# Patient Record
Sex: Male | Born: 1950 | Race: White | Hispanic: No | Smoking: Never smoker
Health system: Southern US, Community
[De-identification: ages and names within clinical notes are randomized; demographics above are authoritative.]

## PROBLEM LIST (undated history)

## (undated) DIAGNOSIS — E785 Hyperlipidemia, unspecified: Secondary | ICD-10-CM

## (undated) DIAGNOSIS — R011 Cardiac murmur, unspecified: Secondary | ICD-10-CM

## (undated) DIAGNOSIS — I341 Nonrheumatic mitral (valve) prolapse: Secondary | ICD-10-CM

## (undated) DIAGNOSIS — I10 Essential (primary) hypertension: Secondary | ICD-10-CM

## (undated) DIAGNOSIS — T1490XA Injury, unspecified, initial encounter: Secondary | ICD-10-CM

## (undated) DIAGNOSIS — I340 Nonrheumatic mitral (valve) insufficiency: Secondary | ICD-10-CM

## (undated) HISTORY — DX: Essential (primary) hypertension: I10

## (undated) HISTORY — DX: Hyperlipidemia, unspecified: E78.5

## (undated) HISTORY — DX: Injury, unspecified, initial encounter: T14.90XA

## (undated) HISTORY — PX: PROSTATE SURGERY: SHX751

## (undated) HISTORY — PX: OTHER SURGICAL HISTORY: SHX169

---

## 1988-11-22 HISTORY — PX: OTHER SURGICAL HISTORY: SHX169

## 2002-09-24 ENCOUNTER — Encounter (INDEPENDENT_AMBULATORY_CARE_PROVIDER_SITE_OTHER): Payer: Self-pay | Admitting: *Deleted

## 2002-09-24 ENCOUNTER — Ambulatory Visit (HOSPITAL_BASED_OUTPATIENT_CLINIC_OR_DEPARTMENT_OTHER): Admission: RE | Admit: 2002-09-24 | Discharge: 2002-09-24 | Payer: Self-pay | Admitting: Surgery

## 2004-12-14 ENCOUNTER — Ambulatory Visit: Payer: Self-pay | Admitting: Internal Medicine

## 2004-12-28 ENCOUNTER — Ambulatory Visit: Payer: Self-pay | Admitting: Internal Medicine

## 2005-01-20 ENCOUNTER — Ambulatory Visit: Payer: Self-pay | Admitting: Internal Medicine

## 2005-02-25 ENCOUNTER — Ambulatory Visit: Payer: Self-pay | Admitting: Internal Medicine

## 2005-04-08 ENCOUNTER — Ambulatory Visit: Payer: Self-pay | Admitting: Internal Medicine

## 2005-08-03 ENCOUNTER — Ambulatory Visit: Payer: Self-pay | Admitting: Internal Medicine

## 2006-11-08 ENCOUNTER — Ambulatory Visit: Payer: Self-pay | Admitting: Internal Medicine

## 2006-11-08 LAB — CONVERTED CEMR LAB
ALT: 39 units/L (ref 0–40)
AST: 34 units/L (ref 0–37)
Alkaline Phosphatase: 48 units/L (ref 39–117)
BUN: 17 mg/dL (ref 6–23)
Basophils Relative: 0.5 % (ref 0.0–1.0)
Calcium: 9.1 mg/dL (ref 8.4–10.5)
Glomerular Filtration Rate, Af Am: 89 mL/min/{1.73_m2}
Hemoglobin: 15.5 g/dL (ref 13.0–17.0)
LDL DIRECT: 159.6 mg/dL
Lymphocytes Relative: 31.7 % (ref 12.0–46.0)
Neutrophils Relative %: 58.9 % (ref 43.0–77.0)
PSA: 5.7 ng/mL — ABNORMAL HIGH (ref 0.10–4.00)
Potassium: 3.5 meq/L (ref 3.5–5.1)
RDW: 12.7 % (ref 11.5–14.6)
TSH: 1.78 microintl units/mL (ref 0.35–5.50)
Total Protein: 6.7 g/dL (ref 6.0–8.3)
WBC: 7.4 10*3/uL (ref 4.5–10.5)

## 2006-11-17 ENCOUNTER — Ambulatory Visit: Payer: Self-pay | Admitting: Internal Medicine

## 2007-01-11 ENCOUNTER — Encounter: Payer: Self-pay | Admitting: Internal Medicine

## 2007-05-29 ENCOUNTER — Ambulatory Visit: Payer: Self-pay | Admitting: Internal Medicine

## 2007-05-29 LAB — CONVERTED CEMR LAB
CO2: 31 meq/L (ref 19–32)
Cholesterol: 172 mg/dL (ref 0–200)
PSA: 6.31 ng/mL — ABNORMAL HIGH (ref 0.10–4.00)
Potassium: 3.8 meq/L (ref 3.5–5.1)
Total CHOL/HDL Ratio: 3.8
Triglycerides: 60 mg/dL (ref 0–149)

## 2007-06-06 ENCOUNTER — Encounter: Payer: Self-pay | Admitting: Internal Medicine

## 2007-06-06 ENCOUNTER — Ambulatory Visit: Payer: Self-pay | Admitting: Internal Medicine

## 2007-06-06 DIAGNOSIS — I10 Essential (primary) hypertension: Secondary | ICD-10-CM | POA: Insufficient documentation

## 2007-06-07 ENCOUNTER — Telehealth: Payer: Self-pay | Admitting: *Deleted

## 2007-07-17 ENCOUNTER — Encounter: Payer: Self-pay | Admitting: Internal Medicine

## 2007-10-23 ENCOUNTER — Encounter: Payer: Self-pay | Admitting: Internal Medicine

## 2007-11-21 ENCOUNTER — Ambulatory Visit: Payer: Self-pay | Admitting: Internal Medicine

## 2007-11-21 LAB — CONVERTED CEMR LAB
ALT: 40 units/L (ref 0–53)
Alkaline Phosphatase: 52 units/L (ref 39–117)
Basophils Absolute: 0 10*3/uL (ref 0.0–0.1)
Eosinophils Absolute: 0.1 10*3/uL (ref 0.0–0.6)
Eosinophils Relative: 1.3 % (ref 0.0–5.0)
GFR calc Af Amer: 81 mL/min
Glucose, Bld: 104 mg/dL — ABNORMAL HIGH (ref 70–99)
Glucose, Urine, Semiquant: NEGATIVE
HCT: 45.8 % (ref 39.0–52.0)
Hemoglobin: 15.9 g/dL (ref 13.0–17.0)
LDL Cholesterol: 146.9 mg/dL
Lymphocytes Relative: 30 % (ref 12.0–46.0)
MCHC: 34.8 g/dL (ref 30.0–36.0)
MCV: 88.8 fL (ref 78.0–100.0)
Monocytes Absolute: 0.5 10*3/uL (ref 0.2–0.7)
Neutro Abs: 5.3 10*3/uL (ref 1.4–7.7)
Neutrophils Relative %: 62.3 % (ref 43.0–77.0)
RBC: 5.15 M/uL (ref 4.22–5.81)
Sodium: 142 meq/L (ref 135–145)
Specific Gravity, Urine: 1.025
TSH: 1.29 microintl units/mL (ref 0.35–5.50)
Total Bilirubin: 1 mg/dL (ref 0.3–1.2)
Total Protein: 6.7 g/dL (ref 6.0–8.3)
Triglycerides: 101 mg/dL (ref 0–149)
WBC Urine, dipstick: NEGATIVE
WBC: 8.5 10*3/uL (ref 4.5–10.5)
pH: 6

## 2007-12-11 ENCOUNTER — Ambulatory Visit: Payer: Self-pay | Admitting: Internal Medicine

## 2007-12-11 DIAGNOSIS — E785 Hyperlipidemia, unspecified: Secondary | ICD-10-CM | POA: Insufficient documentation

## 2007-12-11 LAB — CONVERTED CEMR LAB: HDL goal, serum: 40 mg/dL

## 2009-10-07 ENCOUNTER — Telehealth: Payer: Self-pay | Admitting: Internal Medicine

## 2009-10-31 ENCOUNTER — Ambulatory Visit: Payer: Self-pay | Admitting: Internal Medicine

## 2009-10-31 LAB — CONVERTED CEMR LAB
ALT: 47 units/L (ref 0–53)
Albumin: 3.8 g/dL (ref 3.5–5.2)
Basophils Relative: 0.6 % (ref 0.0–3.0)
CO2: 28 meq/L (ref 19–32)
Chloride: 104 meq/L (ref 96–112)
Creatinine, Ser: 1 mg/dL (ref 0.4–1.5)
Eosinophils Absolute: 0.1 10*3/uL (ref 0.0–0.7)
HCT: 45.1 % (ref 39.0–52.0)
Hemoglobin: 15.7 g/dL (ref 13.0–17.0)
Ketones, urine, test strip: NEGATIVE
Lymphs Abs: 2.5 10*3/uL (ref 0.7–4.0)
MCHC: 34.8 g/dL (ref 30.0–36.0)
MCV: 90.7 fL (ref 78.0–100.0)
Monocytes Absolute: 0.5 10*3/uL (ref 0.1–1.0)
Neutro Abs: 4 10*3/uL (ref 1.4–7.7)
Nitrite: NEGATIVE
PSA: 7.94 ng/mL — ABNORMAL HIGH (ref 0.10–4.00)
Potassium: 3.6 meq/L (ref 3.5–5.1)
RBC: 4.97 M/uL (ref 4.22–5.81)
Sodium: 138 meq/L (ref 135–145)
Specific Gravity, Urine: 1.015
Total CHOL/HDL Ratio: 5
Total Protein: 6.5 g/dL (ref 6.0–8.3)
Triglycerides: 98 mg/dL (ref 0.0–149.0)

## 2009-11-03 ENCOUNTER — Ambulatory Visit: Payer: Self-pay | Admitting: Internal Medicine

## 2010-03-20 ENCOUNTER — Telehealth: Payer: Self-pay | Admitting: Internal Medicine

## 2010-04-02 ENCOUNTER — Ambulatory Visit: Payer: Self-pay | Admitting: Internal Medicine

## 2010-04-02 LAB — CONVERTED CEMR LAB
Albumin: 3.9 g/dL (ref 3.5–5.2)
Cholesterol: 171 mg/dL (ref 0–200)
LDL Cholesterol: 113 mg/dL — ABNORMAL HIGH (ref 0–99)
Total Bilirubin: 1 mg/dL (ref 0.3–1.2)
Triglycerides: 91 mg/dL (ref 0.0–149.0)
VLDL: 18.2 mg/dL (ref 0.0–40.0)

## 2010-04-06 LAB — CONVERTED CEMR LAB: PSA, Free Pct: 14 — ABNORMAL LOW (ref >25)

## 2010-04-08 ENCOUNTER — Ambulatory Visit: Payer: Self-pay | Admitting: Internal Medicine

## 2010-04-29 ENCOUNTER — Encounter: Payer: Self-pay | Admitting: Internal Medicine

## 2010-06-25 ENCOUNTER — Encounter: Payer: Self-pay | Admitting: Internal Medicine

## 2010-09-07 ENCOUNTER — Encounter: Payer: Self-pay | Admitting: Internal Medicine

## 2010-12-22 NOTE — Consult Note (Signed)
Summary: [facility]  [facility]   Imported By: Maryln Gottron 04/13/2010 11:24:28  _____________________________________________________________________  External Attachment:    Type:   Image     Comment:   External Document

## 2010-12-22 NOTE — Assessment & Plan Note (Signed)
Summary: 4 month rov/njr/pt rsc/cjr/pt rsc/cjr   Vital Signs:  Patient profile:   60 year old male Height:      70 inches Weight:      220 pounds BMI:     31.68 Temp:     98.2 degrees F oral Pulse rate:   72 / minute Resp:     14 per minute BP sitting:   138 / 80  (left arm)  Vitals Entered By: Willy Eddy, LPN (Apr 08, 2010 12:04 PM) CC: roa labs   CC:  roa labs.  History of Present Illness: elevated psa referral consult for urology and options of therapy discussed  Preventive Screening-Counseling & Management  Alcohol-Tobacco     Smoking Status: never     Passive Smoke Exposure: no  Problems Prior to Update: 1)  Preventive Health Care  (ICD-V70.0) 2)  Hyperlipidemia  (ICD-272.4) 3)  Benign Prostatic Hypertrophy, Hx of  (ICD-V13.8) 4)  Hypertension  (ICD-401.9)  Current Problems (verified): 1)  Preventive Health Care  (ICD-V70.0) 2)  Hyperlipidemia  (ICD-272.4) 3)  Benign Prostatic Hypertrophy, Hx of  (ICD-V13.8) 4)  Hypertension  (ICD-401.9)  Medications Prior to Update: 1)  Benicar Hct 40-12.5 Mg Tabs (Olmesartan Medoxomil-Hctz) .... One By Mouth Daily 2)  Multivitamins  Caps (Multiple Vitamin) .Marland Kitchen.. 1 Once Daily 3)  Fish Oil Triple Strength 1360 Mg Caps (Omega-3 Fatty Acids) .... Two By Mouth Daily 4)  Glucosamine-Chondroitin 250-200 Mg Caps (Glucosamine-Chondroitin) .Marland Kitchen.. 1 Once Daily 5)  Doxycycline Hyclate 100 Mg Caps (Doxycycline Hyclate) .... One By Mouth Bid For 21 Days  Current Medications (verified): 1)  Benicar Hct 40-12.5 Mg Tabs (Olmesartan Medoxomil-Hctz) .... One By Mouth Daily 2)  Multivitamins  Caps (Multiple Vitamin) .Marland Kitchen.. 1 Once Daily 3)  Fish Oil Triple Strength 1360 Mg Caps (Omega-3 Fatty Acids) .... Two By Mouth Daily 4)  Glucosamine-Chondroitin 250-200 Mg Caps (Glucosamine-Chondroitin) .Marland Kitchen.. 1 Once Daily  Allergies (verified): No Known Drug Allergies  Physical Exam  General:  Well-developed,well-nourished,in no acute distress;  alert,appropriate and cooperative throughout examination Head:  normocephalic and male-pattern balding.   Nose:  no external deformity, mucosal erythema, and mucosal edema.   Mouth:  good dentition and pharynx pink and moist.   Neck:  No deformities, masses, or tenderness noted. Lungs:  Normal respiratory effort, chest expands symmetrically. Lungs are clear to auscultation, no crackles or wheezes. Heart:  Normal rate and regular rhythm. S1 and S2 normal without gallop, murmur, click, rub or other extra sounds.   Impression & Recommendations:  Problem # 1:  BENIGN PROSTATIC HYPERTROPHY, HX OF (ICD-V13.8) elevation of psa significnt case discussed with Bjorn Pippin copies of labs gien to pt and appointment maid for 4 pm today Orders: Urology Referral (Urology)  Complete Medication List: 1)  Benicar Hct 40-12.5 Mg Tabs (Olmesartan medoxomil-hctz) .... One by mouth daily 2)  Multivitamins Caps (Multiple vitamin) .Marland Kitchen.. 1 once daily 3)  Fish Oil Triple Strength 1360 Mg Caps (Omega-3 fatty acids) .... Two by mouth daily 4)  Glucosamine-chondroitin 250-200 Mg Caps (Glucosamine-chondroitin) .Marland Kitchen.. 1 once daily Prescriptions: BENICAR HCT 40-12.5 MG TABS (OLMESARTAN MEDOXOMIL-HCTZ) one by mouth daily  #30 x 6   Entered by:   Willy Eddy, LPN   Authorized by:   Stacie Glaze MD   Signed by:   Willy Eddy, LPN on 40/98/1191   Method used:   Electronically to        [id number] (retail)  [address]       [place], [place]  [address]       Ph: [phone]       Fax: [phone]   RxID:   [id number]

## 2010-12-22 NOTE — Letter (Signed)
Summary: [facility]  [facility]   Imported By: Maryln Gottron 05/05/2010 13:44:41  _____________________________________________________________________  External Attachment:    Type:   Image     Comment:   External Document

## 2010-12-22 NOTE — Op Note (Signed)
Summary: Cystoscopy/[facility]  Cystoscopy/[facility]   Imported By: Maryln Gottron 10/06/2010 09:05:29  _____________________________________________________________________  External Attachment:    Type:   Image     Comment:   External Document

## 2010-12-22 NOTE — Progress Notes (Signed)
Summary: rx for beincar/HCTZ  Phone Note Call from Patient   Summary of Call: patient has finished his samples and would like a rx for benicar/HCTZ 40/12.5. [facility] [phone] patient number today Initial call taken by: Kern Reap CMA Duncan Dull),  March 20, 2010 11:43 AM    Prescriptions: BENICAR HCT 40-12.5 MG TABS (OLMESARTAN MEDOXOMIL-HCTZ) one by mouth daily  #30 x 1   Entered by:   Willy Eddy, LPN   Authorized by:   Stacie Glaze MD   Signed by:   Willy Eddy, LPN on 29/56/2130   Method used:   Electronically to        [facility] [address] (retail)       [address]       [place], [place]  [address]       Ph: [phone]       Fax: [phone]   RxID:   [id number]

## 2010-12-22 NOTE — Letter (Signed)
Summary: [facility]  [facility]   Imported By: Maryln Gottron 07/08/2010 15:57:32  _____________________________________________________________________  External Attachment:    Type:   Image     Comment:   External Document

## 2011-04-09 NOTE — Op Note (Signed)
NAME:  Ricky Fitzgerald, Ricky Fitzgerald                             ACCOUNT NO.:  [id number]   MEDICAL RECORD NO.:  [id number]                   PATIENT TYPE:  AMB   LOCATION:  [facility]                                  FACILITY:  [facility]   PHYSICIAN:  Currie Paris, M.D.           DATE OF BIRTH:  [birth year]   DATE OF PROCEDURE:  09/24/2002  DATE OF DISCHARGE:                                 OPERATIVE REPORT   DOCTOR'S OFFICE MEDICAL RECORDS NUMBER:  [facility] [phone]   PREOPERATIVE DIAGNOSES:  1. Umbilical hernia.  2. Parietal cyst times two.   POSTOPERATIVE DIAGNOSES:   OPERATION PERFORMED:   SURGEON:  Currie Paris, M.D.   ANESTHESIA:   CLINICAL HISTORY:  This patient has symptomatic gradually enlarging  umbilical hernia that he wanted to have fixed.  In addition, he has got a  couple scalp lesions in the left parietal area, one 1 cm and one 2 cm, that  he wished to have removed.  One of them has already stretched the skin out  that he was beginning to show some hair loss.   DESCRIPTION OF OPERATION:  The patient was seen in the holding area.  He had  no further questions.  The two scalp lesions were identified and marked.  We  confirmed that we were planning to remove those plus repair an umbilical  hernia.   The patient was brought to the operating room and initially IV sedation was  given and the abdomen was prepped and draped.  However, as we were  infiltrating a local the patient had a fair amount of movement despite the  fact that he appeared asleep, so anesthesia decided to undertake general  anesthesia with an LMA, which was accomplished nicely.  I made a curvilinear  incision at the base of the umbilicus and freed up the hernia sac off the  undersurface of the umbilicus and was able then to reduce it.  The defect  itself was no more than a centimeter.  I freed up the fascia around it and  placed a small mesh plug in it.  The defect was closed with two sutures of 0-  Prolene  incorporating the mesh into the sutures.  The wound appeared to be  dry so it was closed with some 3-0 Vicryl to tack the skin down to the  fascia so as to recreate an inward appearing umbilicus.  Skin was closed  with 4-0 Monocryl subcuticular.  Sterile dressings were applied using cotton  ball moistened with mineral oil and sterile gauzes.   Attention was turned to the scalp, which had already been shaved.  It was  prepped with some Betadine an again I put some Marcaine local her to help  with postop analgesia.  The larger cyst was excised first with a margin of  skin over it.  Bleeders were controlled with the cautery and then this was  closed with some 3-0 Prolene using horizontal mattress to get a little  compression as the edges were fairly oozing.  The smaller one was removed  similarly, but with a much smaller incision and a couple of vertical  mattresses were used to close this.  I put some Dermabond on to seal the  skin since this was not a location we could use for clear dressing.   The patient tolerated the procedure well.   There were no operative complications.   All counts were correct.                                                  Currie Paris, M.D.    CJS/MEDQ  D:  09/24/2002  T:  09/24/2002  Job:  [id number]   cc:   Stacie Glaze, M.D. [facility]

## 2011-05-10 ENCOUNTER — Other Ambulatory Visit: Payer: PRIVATE HEALTH INSURANCE

## 2011-05-24 ENCOUNTER — Encounter: Payer: Self-pay | Admitting: Internal Medicine

## 2011-05-27 ENCOUNTER — Other Ambulatory Visit: Payer: Self-pay | Admitting: Internal Medicine

## 2011-06-02 ENCOUNTER — Encounter: Payer: Self-pay | Admitting: Internal Medicine

## 2011-07-13 ENCOUNTER — Other Ambulatory Visit (INDEPENDENT_AMBULATORY_CARE_PROVIDER_SITE_OTHER): Payer: PRIVATE HEALTH INSURANCE

## 2011-07-13 ENCOUNTER — Other Ambulatory Visit: Payer: PRIVATE HEALTH INSURANCE

## 2011-07-13 DIAGNOSIS — Z0000 Encounter for general adult medical examination without abnormal findings: Secondary | ICD-10-CM

## 2011-07-13 LAB — LIPID PANEL
Cholesterol: 153 mg/dL (ref 0–200)
LDL Cholesterol: 95 mg/dL (ref 0–99)
Total CHOL/HDL Ratio: 3
VLDL: 10.4 mg/dL (ref 0.0–40.0)

## 2011-07-13 LAB — CBC WITH DIFFERENTIAL/PLATELET
Basophils Relative: 0.6 % (ref 0.0–3.0)
Eosinophils Relative: 0.7 % (ref 0.0–5.0)
HCT: 44 % (ref 39.0–52.0)
Hemoglobin: 14.9 g/dL (ref 13.0–17.0)
Lymphs Abs: 2.3 10*3/uL (ref 0.7–4.0)
Monocytes Relative: 6.7 % (ref 3.0–12.0)
Neutro Abs: 5.5 10*3/uL (ref 1.4–7.7)
RBC: 4.86 Mil/uL (ref 4.22–5.81)
WBC: 8.5 10*3/uL (ref 4.5–10.5)

## 2011-07-13 LAB — HEPATIC FUNCTION PANEL
Bilirubin, Direct: 0.2 mg/dL (ref 0.0–0.3)
Total Protein: 6.8 g/dL (ref 6.0–8.3)

## 2011-07-13 LAB — BASIC METABOLIC PANEL
GFR: 84.92 mL/min (ref >60.00)
Potassium: 4.5 mEq/L (ref 3.5–5.1)
Sodium: 139 mEq/L (ref 135–145)

## 2011-07-13 LAB — POCT URINALYSIS DIPSTICK
Bilirubin, UA: NEGATIVE
Leukocytes, UA: NEGATIVE
Nitrite, UA: NEGATIVE
Protein, UA: NEGATIVE
pH, UA: 6.5

## 2011-07-13 LAB — PSA: PSA: 9.89 ng/mL — ABNORMAL HIGH (ref 0.10–4.00)

## 2011-07-15 ENCOUNTER — Encounter: Payer: Self-pay | Admitting: Internal Medicine

## 2011-07-19 ENCOUNTER — Encounter: Payer: Self-pay | Admitting: Internal Medicine

## 2011-07-19 ENCOUNTER — Ambulatory Visit (INDEPENDENT_AMBULATORY_CARE_PROVIDER_SITE_OTHER): Payer: PRIVATE HEALTH INSURANCE | Admitting: Internal Medicine

## 2011-07-19 ENCOUNTER — Telehealth: Payer: Self-pay | Admitting: *Deleted

## 2011-07-19 VITALS — BP 110/70 | HR 69 | Temp 98.2°F | Resp 16 | Ht 70.0 in | Wt 200.0 lb

## 2011-07-19 DIAGNOSIS — I10 Essential (primary) hypertension: Secondary | ICD-10-CM

## 2011-07-19 DIAGNOSIS — Z0000 Encounter for general adult medical examination without abnormal findings: Secondary | ICD-10-CM

## 2011-07-19 DIAGNOSIS — R972 Elevated prostate specific antigen [PSA]: Secondary | ICD-10-CM

## 2011-07-19 NOTE — Progress Notes (Signed)
  Subjective:    Patient ID: Ricky Fitzgerald, male    DOB: 10-Feb-1951, 60 y.o.   MRN: [id number]  HPI Elevated PSA and had biopsy and cystoscopy ar [facility]. He has a history of hyperlipidemia and hypertension his blood pressure is moderately elevated today. CPX  Review of Systems  Constitutional: Negative for fever and fatigue.  HENT: Negative for hearing loss, congestion, neck pain and postnasal drip.   Eyes: Negative for discharge, redness and visual disturbance.  Respiratory: Negative for cough, shortness of breath and wheezing.   Cardiovascular: Negative for leg swelling.  Gastrointestinal: Negative for abdominal pain, constipation and abdominal distention.  Genitourinary: Negative for urgency and frequency.  Musculoskeletal: Negative for joint swelling and arthralgias.  Skin: Negative for color change and rash.  Neurological: Negative for weakness and light-headedness.  Hematological: Negative for adenopathy.  Psychiatric/Behavioral: Negative for behavioral problems.   Past Medical History  Diagnosis Date  . Hypertension   . Hyperlipidemia   . Trauma    Past Surgical History  Procedure Date  . Rotator cuff sur 1990  . Prostate surgery   . Left wrist fx     reports that he has never smoked. He does not have any smokeless tobacco history on file. He reports that he does not drink alcohol or use illicit drugs. family history is not on file.  He is adopted. Not on File     Objective:   Physical Exam  Nursing note and vitals reviewed. Constitutional: He is oriented to person, place, and time. He appears well-developed and well-nourished.  HENT:  Head: Normocephalic and atraumatic.  Eyes: Conjunctivae are normal. Pupils are equal, round, and reactive to light.  Neck: Normal range of motion. Neck supple.  Cardiovascular: Normal rate and regular rhythm.   Pulmonary/Chest: Effort normal and breath sounds normal.  Abdominal: Soft. Bowel sounds are normal.  Genitourinary:    Post examination deferred to urologist  Musculoskeletal: Normal range of motion.  Neurological: He is alert and oriented to person, place, and time. He has normal reflexes.  Skin: Skin is warm and dry.  Psychiatric: He has a normal mood and affect. His behavior is normal. Judgment and thought content normal.          Assessment & Plan:   Patient presents for yearly preventative medicine examination.   all immunizations and health maintenance protocols were reviewed with the patient and they are up to date with these protocols.   screening laboratory values were reviewed with the patient including screening of hyperlipidemia PSA renal function and hepatic function.   There medications past medical history social history problem list and allergies were reviewed in detail.   Goals were established with regard to weight loss exercise diet in compliance with medications We discussed in detail he is to give elevated PSA and the implications.  His PSA has been stable he is followed by urologist he had biopsies of his prostate which were negative.  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  We discussed his hypertension his hyperlipidemia states his blood pressures have been well controlled outside some elevated in the office due to some anxiety.  Otherwise she is doing well all medications will remain the same followup will be in 6 months with a lipid and liver

## 2011-07-19 NOTE — Telephone Encounter (Signed)
Opened in error

## 2012-05-11 ENCOUNTER — Encounter: Payer: Self-pay | Admitting: Internal Medicine

## 2012-12-13 ENCOUNTER — Other Ambulatory Visit: Payer: Self-pay | Admitting: Internal Medicine

## 2012-12-13 ENCOUNTER — Other Ambulatory Visit (INDEPENDENT_AMBULATORY_CARE_PROVIDER_SITE_OTHER): Payer: BC Managed Care – PPO

## 2012-12-13 DIAGNOSIS — Z0000 Encounter for general adult medical examination without abnormal findings: Secondary | ICD-10-CM

## 2012-12-13 LAB — TSH: TSH: 1.14 u[IU]/mL (ref 0.35–5.50)

## 2012-12-13 LAB — HEPATIC FUNCTION PANEL
AST: 45 U/L — ABNORMAL HIGH (ref 0–37)
Total Bilirubin: 1 mg/dL (ref 0.3–1.2)

## 2012-12-13 LAB — POCT URINALYSIS DIPSTICK
Nitrite, UA: NEGATIVE
Spec Grav, UA: 1.02
Urobilinogen, UA: 0.2
pH, UA: 6

## 2012-12-13 LAB — CBC WITH DIFFERENTIAL/PLATELET
Basophils Absolute: 0 10*3/uL (ref 0.0–0.1)
Eosinophils Absolute: 0.1 10*3/uL (ref 0.0–0.7)
HCT: 43.2 % (ref 39.0–52.0)
Lymphs Abs: 2.1 10*3/uL (ref 0.7–4.0)
MCHC: 34.1 g/dL (ref 30.0–36.0)
Monocytes Absolute: 0.6 10*3/uL (ref 0.1–1.0)
Monocytes Relative: 7 % (ref 3.0–12.0)
Platelets: 217 10*3/uL (ref 150.0–400.0)
RDW: 13.7 % (ref 11.5–14.6)

## 2012-12-13 LAB — BASIC METABOLIC PANEL
BUN: 24 mg/dL — ABNORMAL HIGH (ref 6–23)
Calcium: 8.8 mg/dL (ref 8.4–10.5)
GFR: 85.55 mL/min (ref >60.00)
Glucose, Bld: 99 mg/dL (ref 70–99)
Potassium: 3.6 mEq/L (ref 3.5–5.1)

## 2012-12-13 LAB — LIPID PANEL
LDL Cholesterol: 122 mg/dL — ABNORMAL HIGH (ref 0–99)
Total CHOL/HDL Ratio: 4
VLDL: 15.2 mg/dL (ref 0.0–40.0)

## 2012-12-18 ENCOUNTER — Ambulatory Visit (INDEPENDENT_AMBULATORY_CARE_PROVIDER_SITE_OTHER): Payer: BC Managed Care – PPO | Admitting: Internal Medicine

## 2012-12-18 ENCOUNTER — Encounter: Payer: Self-pay | Admitting: Internal Medicine

## 2012-12-18 VITALS — BP 122/80 | HR 72 | Temp 98.2°F | Resp 16 | Ht 64.0 in | Wt 204.0 lb

## 2012-12-18 DIAGNOSIS — Z0000 Encounter for general adult medical examination without abnormal findings: Secondary | ICD-10-CM

## 2012-12-18 DIAGNOSIS — E559 Vitamin D deficiency, unspecified: Secondary | ICD-10-CM

## 2012-12-18 NOTE — Progress Notes (Signed)
Subjective:    Patient ID: Ricky Fitzgerald, male    DOB: 08/29/1951, 62 y.o.   MRN: [id number]  HPI CPX Psoriasis Hx of prostate cancer Blood pressure stable     Review of Systems  Constitutional: Negative for fever and fatigue.  HENT: Negative for hearing loss, congestion, neck pain and postnasal drip.   Eyes: Negative for discharge, redness and visual disturbance.  Respiratory: Negative for cough, shortness of breath and wheezing.   Cardiovascular: Negative for leg swelling.  Gastrointestinal: Negative for abdominal pain, constipation and abdominal distention.  Genitourinary: Negative for urgency and frequency.  Musculoskeletal: Negative for joint swelling and arthralgias.  Skin: Negative for color change and rash.  Neurological: Negative for weakness and light-headedness.  Hematological: Negative for adenopathy.  Psychiatric/Behavioral: Negative for behavioral problems.   Past Medical History  Diagnosis Date  . Hypertension   . Hyperlipidemia   . Trauma     History   Social History  . Marital Status: Divorced    Spouse Name: N/A    Number of Children: N/A  . Years of Education: N/A   Occupational History  . physicians assistant    Social History Main Topics  . Smoking status: Never Smoker   . Smokeless tobacco: Not on file  . Alcohol Use: No  . Drug Use: No  . Sexually Active: Yes   Other Topics Concern  . Not on file   Social History Narrative  . No narrative on file    Past Surgical History  Procedure Date  . Rotator cuff sur 1990  . Prostate surgery   . Left wrist fx     Family History  Problem Relation Age of Onset  . Adopted: Yes    Not on File  Current Outpatient Prescriptions on File Prior to Visit  Medication Sig Dispense Refill  . BENICAR HCT 40-12.5 MG per tablet TAKE 1 TABLET EACH DAY.  30 tablet  6  . fish oil-omega-3 fatty acids 1000 MG capsule Take 1,360 mg by mouth daily.        Marland Kitchen glucosamine-chondroitin 500-400 MG tablet Take  1 tablet by mouth daily.        . multivitamin (THERAGRAN) per tablet Take 1 tablet by mouth daily.          BP 122/80  Pulse 72  Temp 98.2 F (36.8 C)  Resp 16  Ht 5\' 4"  (1.626 m)  Wt 204 lb (92.534 kg)  BMI 35.02 kg/m2       Objective:   Physical Exam  Nursing note and vitals reviewed. Constitutional: He appears well-developed and well-nourished.  HENT:  Head: Normocephalic and atraumatic.  Eyes: Conjunctivae normal are normal. Pupils are equal, round, and reactive to light.  Neck: Normal range of motion. Neck supple.  Cardiovascular: Normal rate and regular rhythm.   Pulmonary/Chest: Effort normal and breath sounds normal.  Abdominal: Soft. Bowel sounds are normal.  Genitourinary: Rectum normal and prostate normal.  Musculoskeletal: Normal range of motion.  Neurological: He is alert.  Skin: Skin is warm.  Psychiatric: He has a normal mood and affect. His behavior is normal.          Assessment & Plan:   Patient presents for yearly preventative medicine examination.   all immunizations and health maintenance protocols were reviewed with the patient and they are up to date with these protocols.   screening laboratory values were reviewed with the patient including screening of hyperlipidemia PSA renal function and hepatic function.   There  medications past medical history social history problem list and allergies were reviewed in detail.   Goals were established with regard to weight loss exercise diet in compliance with medications  Discussion of gluten free with psorisis D3

## 2012-12-18 NOTE — Patient Instructions (Signed)
The patient is instructed to continue all medications as prescribed. Schedule followup with check out clerk upon leaving the clinic  

## 2013-01-06 ENCOUNTER — Other Ambulatory Visit: Payer: Self-pay

## 2013-05-24 ENCOUNTER — Telehealth: Payer: Self-pay | Admitting: Internal Medicine

## 2013-05-24 NOTE — Telephone Encounter (Signed)
Patient's prior auth for Benicar HCT has been denied. These are alternatives:  Candesartan Cilexetil-HCTZ  Valsartan-HCTZ Losartan Potassium-HCTZ Irbesartan-HCTZ  Telmisartan-HCTZ  [facility]

## 2013-05-28 ENCOUNTER — Other Ambulatory Visit: Payer: Self-pay | Admitting: *Deleted

## 2013-05-28 MED ORDER — TELMISARTAN-HCTZ 80-12.5 MG PO TABS: 1.0000 | ORAL_TABLET | Freq: Every day | ORAL | Status: DC

## 2013-05-28 NOTE — Telephone Encounter (Signed)
Please see the answer

## 2013-05-28 NOTE — Telephone Encounter (Signed)
May change to micardis 80/12.5

## 2013-09-27 ENCOUNTER — Other Ambulatory Visit: Payer: Self-pay

## 2020-08-07 ENCOUNTER — Other Ambulatory Visit (HOSPITAL_COMMUNITY): Payer: Self-pay | Admitting: Internal Medicine

## 2020-08-07 DIAGNOSIS — R011 Cardiac murmur, unspecified: Secondary | ICD-10-CM

## 2020-08-29 ENCOUNTER — Other Ambulatory Visit: Payer: Self-pay

## 2020-08-29 ENCOUNTER — Ambulatory Visit (HOSPITAL_COMMUNITY): Payer: Medicare Other | Attending: Cardiology

## 2020-08-29 DIAGNOSIS — R011 Cardiac murmur, unspecified: Secondary | ICD-10-CM | POA: Diagnosis present

## 2020-08-29 LAB — ECHOCARDIOGRAM COMPLETE
AR max vel: 2.28 cm2
AV Area VTI: 2.18 cm2
AV Area mean vel: 2.26 cm2
AV Mean grad: 5.3 mmHg
AV Peak grad: 9.5 mmHg
Ao pk vel: 1.54 m/s
Area-P 1/2: 3.85 cm2
MV M vel: 5.42 m/s
MV Peak grad: 117.3 mmHg
S' Lateral: 3 cm

## 2020-09-05 ENCOUNTER — Telehealth: Payer: Self-pay | Admitting: Cardiology

## 2020-09-05 NOTE — Telephone Encounter (Signed)
Mr. Niazi requested Echo Report on CD and Paper copy for himself and Dr. Hoover Brunette Office. Received CD in MR.09/05/20. L/M with patient  Ready for Pick up.

## 2020-09-23 ENCOUNTER — Ambulatory Visit (INDEPENDENT_AMBULATORY_CARE_PROVIDER_SITE_OTHER): Payer: Medicare Other | Admitting: Cardiovascular Disease

## 2020-09-23 ENCOUNTER — Encounter: Payer: Self-pay | Admitting: Cardiovascular Disease

## 2020-09-23 ENCOUNTER — Other Ambulatory Visit: Payer: Self-pay

## 2020-09-23 DIAGNOSIS — I340 Nonrheumatic mitral (valve) insufficiency: Secondary | ICD-10-CM

## 2020-09-23 NOTE — Patient Instructions (Addendum)
You are scheduled for a TEE on Wednesday 11/17 with Dr. Audie Box.  Please arrive at the [facility] (Main Entrance A) at [facility]: [place], [place] [address] at 9:30 AM    DIET: Nothing to eat or drink after midnight except a sip of water with medications  Labs: BMET, CBC- 11/10 at [place] in office lab hours are Monday-Friday 8:00-4:00, closed for lunch 12:45-1:45 pm.  No appointment needed.  You will need a COVID-19 test prior to your procedure. You are scheduled for Saturday 11/13 at 9:05 AM. This is a Drive Up Visit at [address] [place], [place] [address]. Someone will direct you to the appropriate testing line. Stay in your car and someone will be with you shortly.  You must have a responsible person to drive you home and stay in the waiting area during your procedure. Failure to do so could result in cancellation.  Bring your insurance cards.   *Special Note: Every effort is made to have your procedure done on time. Occasionally there are emergencies that occur at the hospital that may cause delays. Please be patient if a delay does occur.     You have been referred to Dr. Roxy Manns after your procedure  Follow up with Dr. Gwenlyn Found 2/4 at 9 AM

## 2020-09-23 NOTE — H&P (View-Only) (Signed)
09/23/2020 Ricky Fitzgerald   09/08/1951  [id number]  Primary Physician Velna Hatchet, MD Primary Cardiologist: Lorretta Harp MD Lupe Carney, [place]  HPI:  Ricky Fitzgerald is a 69 y.o. 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 Ardeth Perfect, for evaluation of severe mitral vegetation.  His only risk factor for cardiovascular disease is treated hypertension.  There is no family history for heart disease.  Never had an attack or stroke.  He denies chest pain or shortness of breath.  He is very active and rides bikes and skis in the winter.  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  He is completely asymptomatic.   Current Meds  Medication Sig  . dutasteride (AVODART) 0.5 MG capsule Take 0.5 mg by mouth daily.  . fish oil-omega-3 fatty acids 1000 MG capsule Take 1,360 mg by mouth daily.    . multivitamin (THERAGRAN) per tablet Take 1 tablet by mouth daily.    Marland Kitchen telmisartan-hydrochlorothiazide (MICARDIS HCT) 80-12.5 MG per tablet Take 1 tablet by mouth daily.  . [DISCONTINUED] glucosamine-chondroitin 500-400 MG tablet Take 1 tablet by mouth daily.       No Known Allergies  Social History   Socioeconomic History  . Marital status: Divorced    Spouse name: Not on file  . Number of children: Not on file  . Years of education: Not on file  . Highest education level: Not on file  Occupational History  . Occupation: Surveyor, minerals  Tobacco Use  . Smoking status: Never Smoker  . Smokeless tobacco: Never Used  Substance and Sexual Activity  . Alcohol use: No  . Drug use: No  . Sexual activity: Yes  Other Topics Concern  . Not on file  Social  History Narrative  . Not on file   Social Determinants of Health   Financial Resource Strain:   . Difficulty of Paying Living Expenses: Not on file  Food Insecurity:   . Worried About Charity fundraiser in the Last Year: Not on file  . Ran Out of Food in the Last Year: Not on file  Transportation Needs:   . Lack of Transportation (Medical): Not on file  . Lack of Transportation (Non-Medical): Not on file  Physical Activity:   . Days of Exercise per Week: Not on file  . Minutes of Exercise per Session: Not on file  Stress:   . Feeling of Stress : Not on file  Social Connections:   . Frequency of Communication with Friends and Family: Not on file  . Frequency of Social Gatherings with Friends and Family: Not on file  . Attends Religious Services: Not on file  . Active Member of Clubs or Organizations: Not on file  . Attends Archivist Meetings: Not on file  . Marital Status: Not on file  Intimate Partner Violence:   . Fear of Current or Ex-Partner: Not on file  . Emotionally Abused: Not on file  . Physically Abused: Not on file  . Sexually Abused: Not on file     Review of Systems: General: negative for chills, fever, night sweats or weight changes.  Cardiovascular: negative  for chest pain, dyspnea on exertion, edema, orthopnea, palpitations, paroxysmal nocturnal dyspnea or shortness of breath Dermatological: negative for rash Respiratory: negative for cough or wheezing Urologic: negative for hematuria Abdominal: negative for nausea, vomiting, diarrhea, bright red blood per rectum, melena, or hematemesis Neurologic: negative for visual changes, syncope, or dizziness All other systems reviewed and are otherwise negative except as noted above.    Blood pressure (!) 142/90, pulse 73, height 5\' 10"  (1.778 m), weight 206 lb (93.4 kg).  General appearance: alert and no distress Neck: no adenopathy, no carotid bruit, no JVD, supple, symmetrical, trachea midline and  thyroid not enlarged, symmetric, no tenderness/mass/nodules Lungs: clear to auscultation bilaterally Heart: 2-3 over 6 high-pitched apical systolic murmur consistent with mitral regurgitation Extremities: extremities normal, atraumatic, no cyanosis or edema Pulses: 2+ and symmetric Skin: Skin color, texture, turgor normal. No rashes or lesions Neurologic: Alert and oriented X 3, normal strength and tone. Normal symmetric reflexes. Normal coordination and gait  EKG sinus bradycardia 53 with incomplete right bundle branch block and left anterior fascicular block.  I personally reviewed this EKG.  ASSESSMENT AND PLAN:   HYPERTENSION History of essential hypertension a blood pressure measured today 142/90.  He is on Micardis/hydrochlorothiazide.  Severe mitral regurgitation Recent 2D echo performed 08/29/2020 showed posterior mitral leaflet prolapse with anterior directed jet and probably severe MR.  He does have mild left atrial enlargement and normal LV systolic function.  Based on this, I am going to get a transesophageal echo which will be performed by Dr. Audie Box  and referral to Dr. Lilly Cove  for consideration of mitral valve repair      Lorretta Harp MD [facility], [facility] 09/23/2020 4:45 PM

## 2020-09-23 NOTE — Assessment & Plan Note (Signed)
History of essential hypertension a blood pressure measured today 142/90.  He is on Micardis/hydrochlorothiazide.

## 2020-09-23 NOTE — Progress Notes (Signed)
09/23/2020 Ricky Fitzgerald   [birth year]  [id number]  Primary Physician Velna Hatchet, MD Primary Cardiologist: Lorretta Harp MD Lupe Carney, [place]  HPI:  Ricky Fitzgerald is a 69 y.o. 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 Ardeth Perfect, for evaluation of severe mitral vegetation.  His only risk factor for cardiovascular disease is treated hypertension.  There is no family history for heart disease.  Never had an attack or stroke.  He denies chest pain or shortness of breath.  He is very active and rides bikes and skis in the winter.  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  He is completely asymptomatic.   Current Meds  Medication Sig  . dutasteride (AVODART) 0.5 MG capsule Take 0.5 mg by mouth daily.  . fish oil-omega-3 fatty acids 1000 MG capsule Take 1,360 mg by mouth daily.    . multivitamin (THERAGRAN) per tablet Take 1 tablet by mouth daily.    Marland Kitchen telmisartan-hydrochlorothiazide (MICARDIS HCT) 80-12.5 MG per tablet Take 1 tablet by mouth daily.  . [DISCONTINUED] glucosamine-chondroitin 500-400 MG tablet Take 1 tablet by mouth daily.       No Known Allergies  Social History   Socioeconomic History  . Marital status: Divorced    Spouse name: Not on file  . Number of children: Not on file  . Years of education: Not on file  . Highest education level: Not on file  Occupational History  . Occupation: Surveyor, minerals  Tobacco Use  . Smoking status: Never Smoker  . Smokeless tobacco: Never Used  Substance and Sexual Activity  . Alcohol use: No  . Drug use: No  . Sexual activity: Yes  Other Topics Concern  . Not on file  Social  History Narrative  . Not on file   Social Determinants of Health   Financial Resource Strain:   . Difficulty of Paying Living Expenses: Not on file  Food Insecurity:   . Worried About Charity fundraiser in the Last Year: Not on file  . Ran Out of Food in the Last Year: Not on file  Transportation Needs:   . Lack of Transportation (Medical): Not on file  . Lack of Transportation (Non-Medical): Not on file  Physical Activity:   . Days of Exercise per Week: Not on file  . Minutes of Exercise per Session: Not on file  Stress:   . Feeling of Stress : Not on file  Social Connections:   . Frequency of Communication with Friends and Family: Not on file  . Frequency of Social Gatherings with Friends and Family: Not on file  . Attends Religious Services: Not on file  . Active Member of Clubs or Organizations: Not on file  . Attends Archivist Meetings: Not on file  . Marital Status: Not on file  Intimate Partner Violence:   . Fear of Current or Ex-Partner: Not on file  . Emotionally Abused: Not on file  . Physically Abused: Not on file  . Sexually Abused: Not on file     Review of Systems: General: negative for chills, fever, night sweats or weight changes.  Cardiovascular: negative  for chest pain, dyspnea on exertion, edema, orthopnea, palpitations, paroxysmal nocturnal dyspnea or shortness of breath Dermatological: negative for rash Respiratory: negative for cough or wheezing Urologic: negative for hematuria Abdominal: negative for nausea, vomiting, diarrhea, bright red blood per rectum, melena, or hematemesis Neurologic: negative for visual changes, syncope, or dizziness All other systems reviewed and are otherwise negative except as noted above.    Blood pressure (!) 142/90, pulse 73, height 5\' 10"  (1.778 m), weight 206 lb (93.4 kg).  General appearance: alert and no distress Neck: no adenopathy, no carotid bruit, no JVD, supple, symmetrical, trachea midline and  thyroid not enlarged, symmetric, no tenderness/mass/nodules Lungs: clear to auscultation bilaterally Heart: 2-3 over 6 high-pitched apical systolic murmur consistent with mitral regurgitation Extremities: extremities normal, atraumatic, no cyanosis or edema Pulses: 2+ and symmetric Skin: Skin color, texture, turgor normal. No rashes or lesions Neurologic: Alert and oriented X 3, normal strength and tone. Normal symmetric reflexes. Normal coordination and gait  EKG sinus bradycardia 53 with incomplete right bundle branch block and left anterior fascicular block.  I personally reviewed this EKG.  ASSESSMENT AND PLAN:   HYPERTENSION History of essential hypertension a blood pressure measured today 142/90.  He is on Micardis/hydrochlorothiazide.  Severe mitral regurgitation Recent 2D echo performed 08/29/2020 showed posterior mitral leaflet prolapse with anterior directed jet and probably severe MR.  He does have mild left atrial enlargement and normal LV systolic function.  Based on this, I am going to get a transesophageal echo which will be performed by Dr. Audie Box  and referral to Dr. Lilly Cove  for consideration of mitral valve repair      Lorretta Harp MD [facility], [facility] 09/23/2020 4:45 PM

## 2020-09-23 NOTE — Assessment & Plan Note (Signed)
Recent 2D echo performed 08/29/2020 showed posterior mitral leaflet prolapse with anterior directed jet and probably severe MR.  He does have mild left atrial enlargement and normal LV systolic function.  Based on this, I am going to get a transesophageal echo which will be performed by Dr. Audie Box  and referral to Dr. Lilly Cove  for consideration of mitral valve repair

## 2020-10-01 LAB — BASIC METABOLIC PANEL
BUN/Creatinine Ratio: 18 (ref 10–24)
BUN: 16 mg/dL (ref 8–27)
CO2: 26 mmol/L (ref 20–29)
Calcium: 9.1 mg/dL (ref 8.6–10.2)
Chloride: 97 mmol/L (ref 96–106)
Creatinine, Ser: 0.88 mg/dL (ref 0.76–1.27)
GFR calc Af Amer: 101 mL/min/{1.73_m2} (ref >59)
GFR calc non Af Amer: 88 mL/min/{1.73_m2} (ref >59)
Glucose: 92 mg/dL (ref 65–99)
Potassium: 3.9 mmol/L (ref 3.5–5.2)
Sodium: 135 mmol/L (ref 134–144)

## 2020-10-01 LAB — CBC
Hematocrit: 46.3 % (ref 37.5–51.0)
Hemoglobin: 15.7 g/dL (ref 13.0–17.7)
MCH: 30.2 pg (ref 26.6–33.0)
MCHC: 33.9 g/dL (ref 31.5–35.7)
MCV: 89 fL (ref 79–97)
Platelets: 194 10*3/uL (ref 150–450)
RBC: 5.2 x10E6/uL (ref 4.14–5.80)
RDW: 12.7 % (ref 11.6–15.4)
WBC: 8.7 10*3/uL (ref 3.4–10.8)

## 2020-10-04 ENCOUNTER — Other Ambulatory Visit (HOSPITAL_COMMUNITY): Payer: Medicare Other

## 2020-10-06 ENCOUNTER — Other Ambulatory Visit (HOSPITAL_COMMUNITY): Payer: Medicare Other

## 2020-10-06 ENCOUNTER — Telehealth: Payer: Self-pay | Admitting: Cardiovascular Disease

## 2020-10-06 ENCOUNTER — Other Ambulatory Visit: Payer: Self-pay

## 2020-10-06 DIAGNOSIS — I340 Nonrheumatic mitral (valve) insufficiency: Secondary | ICD-10-CM

## 2020-10-06 NOTE — Telephone Encounter (Signed)
I will discuss with Dr. Audie Box and give you a call back. Thank you

## 2020-10-06 NOTE — Telephone Encounter (Signed)
TEE date has been changed, orders for labs, covid and TEE placed.  Will send message through my chart and will mail letter to pt. Will call pt and update him on the new plan for TEE.

## 2020-10-06 NOTE — Telephone Encounter (Signed)
Patient states he is returning a call. May be regarding lab results. Patient states he would also like to make Dr. Audie Box aware that he is unable to quarantine after COVID test. 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 Please return call to discuss.

## 2020-10-06 NOTE — Telephone Encounter (Signed)
Called pt back. Pt is working today and tomorrow and conflicts with his schedule are causing barriers with pt getting his covid test for TEE on 11/17 at 930am.  Pt is talking to supervisor to attempt to get covid test tomorrow.  Pt will call the office to discuss once he talks to his supervisor. Pt states that if we need to reschedule covid test and TEE he has some days off from work that would be convenient 11/22 and 23, or 11/30 and 12/1.

## 2020-10-06 NOTE — Telephone Encounter (Signed)
Followed up with the pt.  Gave pt new date for TEE as well as orders for labs and covid test. Pt states understanding and is appreciative.   Advised pt that if he has any additional questions to not hesitate to call the office.

## 2020-10-06 NOTE — Telephone Encounter (Signed)
Patient is following up regarding rescheduling TEE. He states he is ready to reschedule for either 10/14/20 or 10/22/20 and he can have the COVID test the day before. Please call.

## 2020-10-08 ENCOUNTER — Encounter (HOSPITAL_COMMUNITY): Admission: RE | Payer: Self-pay | Source: Home / Self Care

## 2020-10-08 ENCOUNTER — Ambulatory Visit (HOSPITAL_COMMUNITY): Admission: RE | Admit: 2020-10-08 | Payer: Medicare Other | Source: Home / Self Care | Admitting: Cardiovascular Disease

## 2020-10-08 SURGERY — ECHOCARDIOGRAM, TRANSESOPHAGEAL
Anesthesia: Monitor Anesthesia Care

## 2020-10-21 ENCOUNTER — Other Ambulatory Visit: Payer: Self-pay

## 2020-10-21 ENCOUNTER — Other Ambulatory Visit (HOSPITAL_COMMUNITY)
Admission: RE | Admit: 2020-10-21 | Discharge: 2020-10-21 | Disposition: A | Discharge status: Home / Self Care | Payer: Medicare Other | Source: Ambulatory Visit | Attending: Cardiovascular Disease | Admitting: Cardiovascular Disease

## 2020-10-21 DIAGNOSIS — Z20822 Contact with and (suspected) exposure to covid-19: Secondary | ICD-10-CM | POA: Diagnosis not present

## 2020-10-21 DIAGNOSIS — Z01812 Encounter for preprocedural laboratory examination: Secondary | ICD-10-CM | POA: Diagnosis present

## 2020-10-21 DIAGNOSIS — I340 Nonrheumatic mitral (valve) insufficiency: Secondary | ICD-10-CM

## 2020-10-21 LAB — BASIC METABOLIC PANEL
BUN/Creatinine Ratio: 18 (ref 10–24)
BUN: 18 mg/dL (ref 8–27)
CO2: 27 mmol/L (ref 20–29)
Calcium: 9.2 mg/dL (ref 8.6–10.2)
Chloride: 100 mmol/L (ref 96–106)
Creatinine, Ser: 1 mg/dL (ref 0.76–1.27)
GFR calc Af Amer: 88 mL/min/{1.73_m2} (ref >59)
GFR calc non Af Amer: 76 mL/min/{1.73_m2} (ref >59)
Glucose: 107 mg/dL — ABNORMAL HIGH (ref 65–99)
Potassium: 3.8 mmol/L (ref 3.5–5.2)
Sodium: 140 mmol/L (ref 134–144)

## 2020-10-21 LAB — CBC
Hematocrit: 44.7 % (ref 37.5–51.0)
Hemoglobin: 15.5 g/dL (ref 13.0–17.7)
MCH: 30.2 pg (ref 26.6–33.0)
MCHC: 34.7 g/dL (ref 31.5–35.7)
MCV: 87 fL (ref 79–97)
Platelets: 205 10*3/uL (ref 150–450)
RBC: 5.13 x10E6/uL (ref 4.14–5.80)
RDW: 12.3 % (ref 11.6–15.4)
WBC: 6 10*3/uL (ref 3.4–10.8)

## 2020-10-21 LAB — SARS CORONAVIRUS 2 (TAT 6-24 HRS): SARS Coronavirus 2: NEGATIVE

## 2020-10-22 ENCOUNTER — Ambulatory Visit (HOSPITAL_BASED_OUTPATIENT_CLINIC_OR_DEPARTMENT_OTHER): Payer: Medicare Other

## 2020-10-22 ENCOUNTER — Ambulatory Visit (HOSPITAL_COMMUNITY)
Admission: RE | Admit: 2020-10-22 | Discharge: 2020-10-22 | Disposition: A | Discharge status: Home / Self Care | Payer: Medicare Other | Attending: Cardiovascular Disease | Admitting: Cardiovascular Disease

## 2020-10-22 ENCOUNTER — Encounter (HOSPITAL_COMMUNITY): Payer: Self-pay | Admitting: Cardiovascular Disease

## 2020-10-22 ENCOUNTER — Encounter (HOSPITAL_COMMUNITY)
Admission: RE | Disposition: A | Discharge status: Home / Self Care | Payer: Self-pay | Source: Home / Self Care | Attending: Cardiovascular Disease

## 2020-10-22 ENCOUNTER — Ambulatory Visit (HOSPITAL_COMMUNITY): Payer: Medicare Other | Admitting: Anesthesiology

## 2020-10-22 ENCOUNTER — Other Ambulatory Visit: Payer: Self-pay

## 2020-10-22 DIAGNOSIS — I340 Nonrheumatic mitral (valve) insufficiency: Secondary | ICD-10-CM | POA: Insufficient documentation

## 2020-10-22 DIAGNOSIS — Z79899 Other long term (current) drug therapy: Secondary | ICD-10-CM | POA: Diagnosis not present

## 2020-10-22 DIAGNOSIS — I341 Nonrheumatic mitral (valve) prolapse: Secondary | ICD-10-CM | POA: Insufficient documentation

## 2020-10-22 DIAGNOSIS — I10 Essential (primary) hypertension: Secondary | ICD-10-CM | POA: Diagnosis not present

## 2020-10-22 HISTORY — PX: TEE WITHOUT CARDIOVERSION: SHX5443

## 2020-10-22 LAB — ECHO TEE
MV M vel: 4.31 m/s
MV Peak grad: 74.3 mmHg
Radius: 1.2 cm

## 2020-10-22 SURGERY — ECHOCARDIOGRAM, TRANSESOPHAGEAL
Anesthesia: Monitor Anesthesia Care

## 2020-10-22 MED ORDER — BUTAMBEN-TETRACAINE-BENZOCAINE 2-2-14 % EX AERO
INHALATION_SPRAY | CUTANEOUS | Status: DC | PRN
  Administered 2020-10-22: 2 via TOPICAL

## 2020-10-22 MED ORDER — SODIUM CHLORIDE 0.9 % IV SOLN: INTRAVENOUS | Status: DC

## 2020-10-22 MED ORDER — PROPOFOL 500 MG/50ML IV EMUL
INTRAVENOUS | Status: DC | PRN
  Administered 2020-10-22: 150 ug/kg/min via INTRAVENOUS

## 2020-10-22 MED ORDER — PROPOFOL 10 MG/ML IV BOLUS
INTRAVENOUS | Status: DC | PRN
  Administered 2020-10-22: 30 mg via INTRAVENOUS
  Administered 2020-10-22: 25 mg via INTRAVENOUS

## 2020-10-22 NOTE — Op Note (Signed)
INDICATIONS: mitral insufficiency  PROCEDURE:   Informed consent was obtained prior to the procedure. The risks, benefits and alternatives for the procedure were discussed and the patient comprehended these risks.  Risks include, but are not limited to, cough, sore throat, vomiting, nausea, somnolence, esophageal and stomach trauma or perforation, bleeding, low blood pressure, aspiration, pneumonia, infection, trauma to the teeth and death.    After a procedural time-out, the oropharynx was anesthetized with 20% benzocaine spray.   During this procedure the patient was administered IV propofol by Anesthesiology.  The transesophageal probe was inserted in the esophagus and stomach without difficulty and multiple views were obtained.  The patient was kept under observation until the patient left the procedure room.  The patient left the procedure room in stable condition.   Agitated microbubble saline contrast was administered.  COMPLICATIONS:    There were no immediate complications.  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 Normal left ventricular systolic function. Otherwise normal echo.  RECOMMENDATIONS:    Consider referral for MV repair.  Time Spent Directly with the Patient:  45 minutes   Allecia Bells 10/22/2020, 2:31 PM

## 2020-10-22 NOTE — Transfer of Care (Signed)
Immediate Anesthesia Transfer of Care Note  Patient: Ricky Fitzgerald  Procedure(s) Performed: TRANSESOPHAGEAL ECHOCARDIOGRAM (TEE) (N/A )  Patient Location: Endoscopy Unit  Anesthesia Type:MAC  Level of Consciousness: drowsy  Airway & Oxygen Therapy: Patient Spontanous Breathing and Patient connected to nasal cannula oxygen  Post-op Assessment: Report given to RN and Post -op Vital signs reviewed and stable  Post vital signs: Reviewed and stable  Last Vitals:  Vitals Value Taken Time  BP 106/71 10/22/20 1435  Temp    Pulse 52 10/22/20 1435  Resp 20 10/22/20 1435  SpO2 93 % 10/22/20 1435  Vitals shown include unvalidated device data.  Last Pain:  Vitals:   10/22/20 1355  TempSrc: Temporal  PainSc: 0-No pain         Complications: No complications documented.

## 2020-10-22 NOTE — Discharge Instructions (Signed)
Transesophageal Echocardiogram Transesophageal echocardiogram (TEE) is a test that uses sound waves to take pictures of your heart. TEE is done by passing a flexible tube down the esophagus. The esophagus is the tube that carries food from the throat to the stomach. The pictures give detailed images of your heart. This can help your doctor see if there are problems with your heart. What happens before the procedure? Staying hydrated Follow instructions from your doctor about hydration, which may include:  Up to 3 hours before the procedure - you may continue to drink clear liquids, such as: ? Water. ? Clear fruit juice. ? Black coffee. ? Plain tea.  Eating and drinking Follow instructions from your doctor about eating and drinking, which may include:  8 hours before the procedure - stop eating heavy meals or foods such as meat, fried foods, or fatty foods.  6 hours before the procedure - stop eating light meals or foods, such as toast or cereal.  6 hours before the procedure - stop drinking milk or drinks that contain milk.  3 hours before the procedure - stop drinking clear liquids. General instructions  You will need to take out any dentures or retainers.  Plan to have someone take you home from the hospital or clinic.  If you will be going home right after the procedure, plan to have someone with you for 24 hours.  Ask your doctor about: ? Changing or stopping your normal medicines. This is important if you take diabetes medicines or blood thinners. ? Taking over-the-counter medicines, vitamins, herbs, and supplements. ? Taking medicines such as aspirin and ibuprofen. These medicines can thin your blood. Do not take these medicines unless your doctor tells you to take them. What happens during the procedure?  To lower your risk of infection, your doctors will wash or clean their hands.  An IV will be put into one of your veins.  You will be given a medicine to help you  relax (sedative).  A medicine may be sprayed or gargled. This numbs the back of your throat.  Your blood pressure, heart rate, and breathing will be watched.  You may be asked to lay on your left Donivan.  A bite block will be placed in your mouth. This keeps you from biting the tube.  The tip of the TEE probe will be placed into the back of your mouth.  You will be asked to swallow.  Your doctor will take pictures of your heart.  The probe and bite block will be taken out. The procedure may vary among doctors and hospitals. What happens after the procedure?   Your blood pressure, heart rate, breathing rate, and blood oxygen level will be watched until the medicines you were given have worn off.  When you first wake up, your throat may feel sore and numb. This will get better over time. You will not be allowed to eat or drink until the numbness has gone away.  Do not drive for 24 hours if you were given a medicine to help you relax. Summary  TEE is a test that uses sound waves to take pictures of your heart.  You will be given a medicine to help you relax.  Do not drive for 24 hours if you were given a medicine to help you relax. This information is not intended to replace advice given to you by your health care provider. Make sure you discuss any questions you have with your health care provider. Document Revised:   07/28/2018 Document Reviewed: 02/09/2017 Elsevier Patient Education  2020 Elsevier Inc.  

## 2020-10-22 NOTE — Progress Notes (Signed)
  Echocardiogram Echocardiogram Transesophageal has been performed.  Jennette Dubin 10/22/2020, 2:55 PM

## 2020-10-22 NOTE — Anesthesia Procedure Notes (Signed)
Procedure Name: MAC Date/Time: 10/22/2020 2:09 PM Performed by: Imagene Riches, CRNA Pre-anesthesia Checklist: Patient identified, Emergency Drugs available, Suction available, Patient being monitored and Timeout performed Patient Re-evaluated:Patient Re-evaluated prior to induction Oxygen Delivery Method: Nasal cannula

## 2020-10-22 NOTE — Anesthesia Preprocedure Evaluation (Signed)
Anesthesia Evaluation  Patient identified by MRN, date of birth, ID band Patient awake    Reviewed: Patient's Chart, lab work & pertinent test results  Airway Mallampati: II  TM Distance: >3 FB Neck ROM: Full    Dental  (+) Teeth Intact   Pulmonary neg pulmonary ROS,    Pulmonary exam normal        Cardiovascular hypertension, + Valvular Problems/Murmurs MR  Rhythm:Regular Rate:Normal + Systolic murmurs    Neuro/Psych negative neurological ROS  negative psych ROS   GI/Hepatic negative GI ROS, Neg liver ROS,   Endo/Other  negative endocrine ROS  Renal/GU negative Renal ROS  negative genitourinary   Musculoskeletal negative musculoskeletal ROS (+)   Abdominal (+)  Abdomen: soft. Bowel sounds: normal.  Peds  Hematology negative hematology ROS (+)   Anesthesia Other Findings   Reproductive/Obstetrics                            Anesthesia Physical Anesthesia Plan  ASA: III  Anesthesia Plan: MAC   Post-op Pain Management:    Induction: Intravenous  PONV Risk Score and Plan: 1 and Propofol infusion and Treatment may vary due to age or medical condition  Airway Management Planned: Simple Face Mask, Natural Airway and Nasal Cannula  Additional Equipment: None  Intra-op Plan:   Post-operative Plan:   Informed Consent: I have reviewed the patients History and Physical, chart, labs and discussed the procedure including the risks, benefits and alternatives for the proposed anesthesia with the patient or authorized representative who has indicated his/her understanding and acceptance.     Dental advisory given  Plan Discussed with: CRNA  Anesthesia Plan Comments: (Lab Results      Component                Value               Date                      WBC                      6.0                 10/21/2020                HGB                      15.5                10/21/2020                 HCT                      44.7                10/21/2020                MCV                      87                  10/21/2020                PLT                      205  10/21/2020          )        Anesthesia Quick Evaluation

## 2020-10-22 NOTE — Interval H&P Note (Signed)
History and Physical Interval Note:  10/22/2020 1:49 PM  Ricky Fitzgerald  has presented today for surgery, with the diagnosis of SEVER [place].  The various methods of treatment have been discussed with the patient and family. After consideration of risks, benefits and other options for treatment, the patient has consented to  Procedure(s): TRANSESOPHAGEAL ECHOCARDIOGRAM (TEE) (N/A) as a surgical intervention.  The patient's history has been reviewed, patient examined, no change in status, stable for surgery.  I have reviewed the patient's chart and labs.  Questions were answered to the patient's satisfaction.     [name]

## 2020-10-23 ENCOUNTER — Encounter (HOSPITAL_COMMUNITY): Payer: Self-pay | Admitting: Cardiovascular Disease

## 2020-10-23 NOTE — Anesthesia Postprocedure Evaluation (Signed)
Anesthesia Post Note  Patient: Ricky Fitzgerald  Procedure(s) Performed: TRANSESOPHAGEAL ECHOCARDIOGRAM (TEE) (N/A )     Patient location during evaluation: Endoscopy Anesthesia Type: MAC Level of consciousness: awake and alert Pain management: pain level controlled Vital Signs Assessment: post-procedure vital signs reviewed and stable Respiratory status: spontaneous breathing, nonlabored ventilation, respiratory function stable and patient connected to nasal cannula oxygen Cardiovascular status: stable and blood pressure returned to baseline Postop Assessment: no apparent nausea or vomiting Anesthetic complications: no   No complications documented.  Last Vitals:  Vitals:   10/22/20 1445 10/22/20 1455  BP: 113/87 123/78  Pulse: (!) 48 (!) 52  Resp: 20 15  Temp:    SpO2: 95% 96%    Last Pain:  Vitals:   10/22/20 1445  TempSrc:   PainSc: 0-No pain                 Belenda Cruise P Aryonna Gunnerson

## 2020-10-30 ENCOUNTER — Institutional Professional Consult (permissible substitution) (INDEPENDENT_AMBULATORY_CARE_PROVIDER_SITE_OTHER): Payer: Medicare Other | Admitting: Thoracic Surgery (Cardiothoracic Vascular Surgery)

## 2020-10-30 ENCOUNTER — Other Ambulatory Visit: Payer: Self-pay

## 2020-10-30 ENCOUNTER — Other Ambulatory Visit: Payer: Self-pay | Admitting: *Deleted

## 2020-10-30 ENCOUNTER — Encounter: Payer: Self-pay | Admitting: Thoracic Surgery (Cardiothoracic Vascular Surgery)

## 2020-10-30 VITALS — BP 156/107 | HR 56 | Resp 18 | Ht 70.0 in | Wt 198.0 lb

## 2020-10-30 DIAGNOSIS — I340 Nonrheumatic mitral (valve) insufficiency: Secondary | ICD-10-CM

## 2020-10-30 DIAGNOSIS — I7103 Dissection of thoracoabdominal aorta: Secondary | ICD-10-CM

## 2020-10-30 DIAGNOSIS — Z01818 Encounter for other preprocedural examination: Secondary | ICD-10-CM

## 2020-10-30 NOTE — Progress Notes (Signed)
[address]       [name]             [phone]     CARDIOTHORACIC SURGERY CONSULTATION REPORT  Referring Provider is Lorretta Harp, MD PCP is Velna Hatchet, MD  Chief Complaint  Patient presents with  . Mitral Regurgitation    TEE 11/17, Echo 10/8  . Consult    Initial surgical consult     HPI:  Patient is a 69 year old male with history of hypertension who has been referred for surgical consultation to discuss treatment options for management of recently discovered mitral valve prolapse with severe mitral regurgitation.  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  He was noted to have a prominent systolic murmur at the time of his most recent routine follow-up physical examination with his primary care physician.  Transthoracic echocardiogram was performed demonstrating the presence of normal left ventricular function with mitral valve prolapse and at least moderate to severe mitral regurgitation.  The patient was seen in consultation by Dr. Gwenlyn Found and subsequently underwent transesophageal echocardiogram October 22, 2020 confirming the presence of mitral valve prolapse involving the P3 segment of the posterior leaflet with ruptured primary chordae tendinae causing severe mitral regurgitation.  Left ventricular function remains preserved.  The patient was referred for surgical consultation.  Patient is single and lives locally in [place].  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  He has remained physically active, exercises regularly, and has had no significant physical limitations throughout adulthood.  He enjoys cycling and skiing and he has previously always exercised on a regular  basis.  He states that this past summer he noticed a tendency to have slightly decreased physical endurance and exercise capacity when riding his bicycle.  He at times feels tachypalpitations that always seem to be induced by strenuous exertion.  He otherwise has remained essentially entirely asymptomatic.  He denies exertional shortness of breath, resting shortness of breath, PND, orthopnea, or lower extremity edema.  He has never had any significant chest pain or chest tightness.  He has essentially stopped exercising on a regular basis since he was found to have murmur on exam and severe mitral regurgitation.  Past Medical History:  Diagnosis Date  . Hyperlipidemia   . Hypertension   . Trauma     Past Surgical History:  Procedure Laterality Date  . left wrist fx    . PROSTATE SURGERY    . rotator cuff sur  1990  . TEE WITHOUT CARDIOVERSION N/A 10/22/2020   Procedure: TRANSESOPHAGEAL ECHOCARDIOGRAM (TEE);  Surgeon: Sanda Klein, MD;  Location: [facility] ENDOSCOPY;  Service: Cardiovascular;  Laterality: N/A;    Family History  Adopted: Yes    Social History   Socioeconomic History  . Marital status: Divorced    Spouse name: Not on file  . Number of children: Not on file  . Years of education: Not on file  . Highest education level: Not on file  Occupational History  . Occupation: Surveyor, minerals  Tobacco Use  . Smoking status: Never Smoker  . Smokeless tobacco: Never Used  Substance and Sexual Activity  . Alcohol use: No  . Drug use: No  . Sexual activity:  Yes  Other Topics Concern  . Not on file  Social History Narrative  . Not on file   Social Determinants of Health   Financial Resource Strain: Not on file  Food Insecurity: Not on file  Transportation Needs: Not on file  Physical Activity: Not on file  Stress: Not on file  Social Connections: Not on file  Intimate Partner Violence: Not on file    Current Outpatient Medications  Medication Sig Dispense Refill   . augmented betamethasone dipropionate (DIPROLENE-AF) 0.05 % cream Apply 1 application topically once a week.    . Cholecalciferol (VITAMIN D-3) 125 MCG (5000 UT) TABS Take 5,000 tablets by mouth in the morning and at bedtime.    . Coenzyme Q10 100 MG TABS Take 100 mg by mouth daily.    Marland Kitchen desonide (DESOWEN) 0.05 % ointment Apply 1 application topically 4 (four) times a week.    . dutasteride (AVODART) 0.5 MG capsule Take 0.5 mg by mouth daily.    . multivitamin (THERAGRAN) per tablet Take 1 tablet by mouth daily.    . Red Yeast Rice Extract (RED YEAST RICE PO) Take 1 tablet by mouth daily at 12 noon.    Marland Kitchen telmisartan-hydrochlorothiazide (MICARDIS HCT) 80-12.5 MG per tablet Take 1 tablet by mouth daily. 30 tablet 11   No current facility-administered medications for this visit.    No Known Allergies    Review of Systems:   General:  normal appetite, normal energy, no weight gain, no weight loss, no fever  Cardiac:  no chest pain with exertion, no chest pain at rest, no SOB with exertion, no resting SOB, no PND, no orthopnea, + exercise induced palpitations, no arrhythmia, no atrial fibrillation, no LE edema, no dizzy spells, no syncope  Respiratory:  no shortness of breath, no home oxygen, no productive cough, no dry cough, no bronchitis, no wheezing, no hemoptysis, no asthma, no pain with inspiration or cough, no sleep apnea, no CPAP at night  GI:   no difficulty swallowing, no reflux, no frequent heartburn, no hiatal hernia, no abdominal pain, no constipation, no diarrhea, no hematochezia, no hematemesis, no melena  GU:   no dysuria,  no frequency, no urinary tract infection, no hematuria, + enlarged prostate, no kidney stones, no kidney disease  Vascular:  no pain suggestive of claudication, no pain in feet, no leg cramps, no varicose veins, no DVT, no non-healing foot ulcer  Neuro:   no stroke, no TIA's, no seizures, no headaches, no temporary blindness one eye,  no slurred speech, no  peripheral neuropathy, no chronic pain, no instability of gait, no memory/cognitive dysfunction  Musculoskeletal: no arthritis, no joint swelling, no myalgias, no difficulty walking, normal mobility   Skin:   no rash, no itching, no skin infections, no pressure sores or ulcerations  Psych:   no anxiety, no depression, no nervousness, no unusual recent stress  Eyes:   no blurry vision, no floaters, no recent vision changes, + wears glasses for reading only  ENT:   no hearing loss, no loose or painful teeth, no dentures, last saw dentist within the past year  Hematologic:  no easy bruising, no abnormal bleeding, no clotting disorder, no frequent epistaxis  Endocrine:  no diabetes, does not check CBG's at home     Physical Exam:   BP (!) 156/107 (BP Location: Left Arm, Patient Position: Sitting)   Pulse (!) 56   Resp 18   Ht 5\' 10"  (1.778 m)   Wt 198 lb (89.8  kg)   SpO2 96% Comment: RA with mask on  BMI 28.41 kg/m   General:    well-appearing  HEENT:  Unremarkable   Neck:   no JVD, no bruits, no adenopathy   Chest:   clear to auscultation, symmetrical breath sounds, no wheezes, no rhonchi   CV:   RRR, grade III/VI blowing holosystolic murmur heard all across the precordium  Abdomen:  soft, non-tender, no masses   Extremities:  warm, well-perfused, pulses palpable, no LE edema  Rectal/GU  Deferred  Neuro:   Grossly non-focal and symmetrical throughout  Skin:   Clean and dry, no rashes, no breakdown   Diagnostic Tests:   ECHOCARDIOGRAM REPORT       Patient Name:  Ricky Fitzgerald  Date of Exam: 08/29/2020  Medical Rec #: [id number]   Height:    64.0 in  Accession #:  [id number]  Weight:    204.0 lb  Date of Birth: 30-Jul-1951   BSA:     1.973 m  Patient Age:  27 years   BP:      122/80 mmHg  Patient Gender: M       HR:      56 bpm.  Exam Location: [facility]   Procedure: 2D Echo, Cardiac Doppler and Color Doppler     Contacted  Dr Hoover Brunette office with results at 3:30pm on 08/29/20.   Indications:   R01.1 Murmur    History:     Patient has no prior history of Echocardiogram  examinations.          Risk Factors:Hypertension and Dyslipidemia.    Sonographer:   Cresenciano Lick RDCS  Referring Phys: [place]  Diagnosing Phys: Oswaldo Milian MD   IMPRESSIONS    1. Left ventricular ejection fraction, by estimation, is 65 to 70%. The  left ventricle has normal function. The left ventricle has no regional  wall motion abnormalities. There is mild left ventricular hypertrophy.  Left ventricular diastolic parameters  are consistent with Grade II diastolic dysfunction (pseudonormalization).  Elevated left atrial pressure.  2. Right ventricular systolic function is normal. The right ventricular  size is mildly enlarged. There is normal pulmonary artery systolic  pressure. The estimated right ventricular systolic pressure is 22.2 mmHg.  3. Left atrial size was mildly dilated.  4. The aortic valve is tricuspid. Aortic valve regurgitation is not  visualized. No aortic stenosis is present.  5. Aortic dilatation noted. There is dilatation of the ascending aorta,  measuring 41 mm.  6. The inferior vena cava is normal in size with greater than 50%  respiratory variability, suggesting right atrial pressure of 3 mmHg.  7. The mitral valve is abnormal. Posterior leaflet prolapse with  eccentric anterior directed jet. Moderate to severe mitral regurgitation.  Difficult to quantify regurgitation given eccentric jet but suspect severe  regurgitation. Recommend TEE for  further evaluation.   FINDINGS  Left Ventricle: Left ventricular ejection fraction, by estimation, is 65  to 70%. The left ventricle has normal function. The left ventricle has no  regional wall motion abnormalities. The left ventricular internal cavity  size was normal in size. There is  mild left  ventricular hypertrophy. Left ventricular diastolic parameters  are consistent with Grade II diastolic dysfunction (pseudonormalization).  Elevated left atrial pressure.   Right Ventricle: The right ventricular size is mildly enlarged. Right  vetricular wall thickness was not assessed. Right ventricular systolic  function is normal. There is normal pulmonary artery systolic pressure.  The tricuspid  regurgitant velocity is  2.64 m/s, and with an assumed right atrial pressure of 3 mmHg, the  estimated right ventricular systolic pressure is 96.2 mmHg.   Left Atrium: Left atrial size was mildly dilated.   Right Atrium: Right atrial size was normal in size.   Pericardium: There is no evidence of pericardial effusion.   Mitral Valve: The mitral valve is abnormal. Moderate to severe mitral  valve regurgitation.   Tricuspid Valve: The tricuspid valve is normal in structure. Tricuspid  valve regurgitation is mild.   Aortic Valve: The aortic valve is tricuspid. Aortic valve regurgitation is  not visualized. No aortic stenosis is present. Aortic valve mean gradient  measures 5.3 mmHg. Aortic valve peak gradient measures 9.5 mmHg. Aortic  valve area, by VTI measures 2.18  cm.   Pulmonic Valve: The pulmonic valve was not well visualized. Pulmonic valve  regurgitation is not visualized.   Aorta: The aortic root is normal in size and structure and aortic  dilatation noted. There is dilatation of the ascending aorta, measuring 41  mm.   Venous: The inferior vena cava is normal in size with greater than 50%  respiratory variability, suggesting right atrial pressure of 3 mmHg.   IAS/Shunts: No atrial level shunt detected by color flow Doppler.     LEFT VENTRICLE  PLAX 2D  LVIDd:     5.00 cm Diastology  LVIDs:     3.00 cm LV e' medial:  5.98 cm/s  LV PW:     0.90 cm LV E/e' medial: 21.4  LV IVS:    0.90 cm LV e' lateral:  9.57 cm/s  LVOT diam:   2.20 cm LV  E/e' lateral: 13.4  LV SV:     70  LV SV Index:  35  LVOT Area:   3.80 cm     RIGHT VENTRICLE       IVC  RV Basal diam: 4.80 cm   IVC diam: 1.80 cm  RV S prime:   14.75 cm/s  TAPSE (M-mode): 2.3 cm   LEFT ATRIUM       Index    RIGHT ATRIUM      Index  LA diam:    5.80 cm 2.94 cm/m RA Area:   19.30 cm  LA Vol (A2C):  78.0 ml 39.54 ml/m RA Volume:  59.00 ml 29.91 ml/m  LA Vol (A4C):  77.9 ml 39.48 ml/m  LA Biplane Vol: 78.9 ml 39.99 ml/m  AORTIC VALVE  AV Area (Vmax):  2.28 cm  AV Area (Vmean):  2.26 cm  AV Area (VTI):   2.18 cm  AV Vmax:      154.00 cm/s  AV Vmean:     107.667 cm/s  AV VTI:      0.319 m  AV Peak Grad:   9.5 mmHg  AV Mean Grad:   5.3 mmHg  LVOT Vmax:     92.30 cm/s  LVOT Vmean:    64.100 cm/s  LVOT VTI:     0.183 m  LVOT/AV VTI ratio: 0.57    AORTA  Ao Root diam: 3.90 cm  Ao Asc diam: 4.10 cm   MITRAL VALVE        TRICUSPID VALVE  MV Area (PHT): 3.85 cm   TR Peak grad:  27.9 mmHg  MV Decel Time: 197 msec   TR Vmax:    264.00 cm/s  MR Peak grad: 117.3 mmHg  MR Mean grad: 88.5 mmHg   SHUNTS  MR Vmax:   541.50  cm/s  Systemic VTI: 0.18 m  MR Vmean:   455.5 cm/s  Systemic Diam: 2.20 cm  MV E velocity: 128.00 cm/s  MV A velocity: 96.70 cm/s  MV E/A ratio: 1.32   Oswaldo Milian MD  Electronically signed by Oswaldo Milian MD  Signature Date/Time: 08/29/2020/3:23:14 PM       TRANSESOPHOGEAL ECHO REPORT       Patient Name:  Ricky Fitzgerald Date of Exam: 10/22/2020  Medical Rec #: [id number]  Height:    70.0 in  Accession #:  [id number] Weight:    199.0 lb  Date of Birth: 02/04/1951  BSA:     2.083 m  Patient Age:  53 years  BP:      113/87 mmHg  Patient Gender: M      HR:      67 bpm.  Exam Location: Inpatient   Procedure: Transesophageal Echo, Color Doppler, Cardiac Doppler  and 3D  Echo   Indications:  Mitral Regurgitation    History:    Patient has prior history of Echocardiogram examinations,  most         recent 08/29/2020. Risk Factors:Hypertension and  Dyslipidemia.    Sonographer:  Mikki Santee RDCS (AE)  Referring Phys: [phone] [facility]   PROCEDURE: The transesophogeal probe was passed without difficulty through  the esophogus of the patient. Sedation performed by different physician.  The patient was monitored while under deep sedation. Anesthestetic  sedation was provided intravenously by  Anesthesiology: 289.78mg  of Propofol. The patient developed no  complications during the procedure.   IMPRESSIONS    1. Left ventricular ejection fraction, by estimation, is 60 to 65%. The  left ventricle has normal function.  2. Right ventricular systolic function is normal. The right ventricular  size is normal. There is normal pulmonary artery systolic pressure. The  estimated right ventricular systolic pressure is 54.6 mmHg.  3. Left atrial size was mildly dilated. No left atrial/left atrial  appendage thrombus was detected.  4. There is at least one ruptured chord to the medial scallop (P3) of the  posterior leaflet, with subsequent flail motion of the P3 scallop and a  highly eccentric regurgitant jet. Vena contracta 5 mm. Effective  regurgitant orifice area is 0.7 cm, the  regurgitant volume is 90 ml, the regurgitant fraction is 60% (By the PISA  method). Regurgitant volume is 81 ml and regurgitant fraction is 57% by  the continuity equation.. The mitral valve is myxomatous. Severe mitral  valve regurgitation. No evidence of  mitral stenosis. There is severe holosystolic prolapse of both leaflets of  the mitral valve.  5. The aortic valve is tricuspid. Aortic valve regurgitation is not  visualized. No aortic stenosis is present.  6. There is mild dilatation of the ascending aorta, measuring 41 mm.   FINDINGS   Left Ventricle: Left ventricular ejection fraction, by estimation, is 60  to 65%. The left ventricle has normal function. The left ventricular  internal cavity size was normal in size. There is no left ventricular  hypertrophy.   Right Ventricle: The right ventricular size is normal. No increase in  right ventricular wall thickness. Right ventricular systolic function is  normal. There is normal pulmonary artery systolic pressure. The tricuspid  regurgitant velocity is 2.23 m/s, and  with an assumed right atrial pressure of 3 mmHg, the estimated right  ventricular systolic pressure is 50.3 mmHg.   Left Atrium: Left atrial size was mildly dilated. No left atrial/left  atrial appendage thrombus was  detected.   Right Atrium: Right atrial size was normal in size.   Pericardium: There is no evidence of pericardial effusion.   Mitral Valve: There is at least one ruptured chord to the medial scallop  (P3) of the posterior leaflet, with subsequent flail motion of the P3  scallop and a highly eccentric regurgitant jet. Vena contracta 5 mm.  Effective regurgitant orifice area is 0.7  cm, the regurgitant volume is 90 ml, the regurgitant fraction is 60% (By  the PISA method). Regurgitant volume is 81 ml and regurgitant fraction is  57% by the continuity equation. The mitral valve is myxomatous. There is  severe holosystolic prolapse of  both leaflets of the mitral valve. Severe mitral valve regurgitation, with  eccentric anteriorly directed jet. No evidence of mitral valve stenosis.   Tricuspid Valve: The tricuspid valve is normal in structure. Tricuspid  valve regurgitation is mild.   Aortic Valve: The aortic valve is tricuspid. Aortic valve regurgitation is  not visualized. No aortic stenosis is present.   Pulmonic Valve: The pulmonic valve was grossly normal. Pulmonic valve  regurgitation is trivial.   Aorta: The aortic root is normal in size and structure. There is mild   dilatation of the ascending aorta, measuring 41 mm. There is minimal  (Grade I) atheroma plaque involving the descending aorta.   IAS/Shunts: No atrial level shunt detected by color flow Doppler.     LEFT VENTRICLE  PLAX 2D  LVOT diam:   2.34 cm  LV SV:     57  LV SV Index:  27  LVOT Area:   4.29 cm     AORTIC VALVE  LVOT Vmax:  81.69 cm/s  LVOT Vmean: 46.655 cm/s  LVOT VTI:  0.132 m   MR Peak grad:  74.3 mmHg  TRICUSPID VALVE  MR Mean grad:  48.0 mmHg  TR Peak grad:  19.9 mmHg  MR Vmax:     431.00 cm/s TR Vmax:    223.00 cm/s  MR Vmean:    326.0 cm/s  MR PISA:     9.05 cm  SHUNTS  MR PISA Eff ROA: 72 mm   Systemic VTI: 0.13 m  MR PISA Radius: 1.20 cm   Systemic Diam: 2.34 cm   Dani Gobble Croitoru MD  Electronically signed by Sanda Klein MD  Signature Date/Time: 10/22/2020/6:24:31 PM      Impression:  Patient has mitral valve prolapse with at least stage C possibly bordering 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  He reports no significant exertional shortness of breath or chest discomfort although he has noticed what he feels is a significant decline in his exercise capacity and endurance.  He has also had reproducible episodes of tachypalpitations that are always initiated by strenuous physical exertion.  I have personally reviewed the patient's recent transthoracic and transesophageal echocardiograms.  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  There is an obvious ruptured primary chordae tendinae involving the P3 segment causing an eccentric jet of regurgitation.  Although quantification of the severity of mitral regurgitation was challenging due to the eccentric nature of the jet, estimates of regurgitant volume ranged between 81 and 90 mL with PISA radius measured 1.20 cm.  Left ventricular  systolic function appears normal.  The aortic valve appears normal.  Right ventricular size and function appears normal.  The tricuspid annulus does not appear dilated and there was only  mild tricuspid regurgitation.  Options include elective mitral valve repair versus continued observation with close medical follow-up.  Based upon review of the patient's recent TEE I feel that the patient's valve should be repairable with greater than 98% confidence with anticipated risk of mortality well below 1%.  The patient would need to undergo diagnostic cardiac catheterization prior to any type of surgical intervention to rule out the presence of coronary artery disease.  In the absence of significant coronary artery disease he may be a good candidate for minimally invasive approach for surgery.   Plan:  The patient was counseled at length regarding his diagnosis of severe primary mitral regurgitation.  We reviewed the results of their diagnostic tests including images from the most recent echocardiogram.  We discussed the natural history of mitral regurgitation as well as alternative treatment strategies.  We discussed the impact of his age, current state of health, and lack of significant comorbid medical problems on clinical decision making.  We went on to discuss the indications, risks and potential benefits of mitral valve repair as well as the timing of surgical intervention.  The rationale for elective surgery has been explained, including a comparison between surgery and continued medical therapy with close follow-up.  The likelihood of successful and durable mitral valve repair has been discussed with particular reference to the findings of the most recent echocardiogram.  Based upon these findings and previous experience, I have quoted a greater than 98 percent likelihood of successful valve repair with less than 1 percent risk of mortality or major morbidity.  Alternative surgical approaches have been  discussed including a comparison between conventional sternotomy and minimally-invasive techniques.  The relative risks and benefits of each have been reviewed as they pertain to the patient's specific circumstances, and expectations for the patient's postoperative convalescence has been discussed.  We also discussed the fact that although anticipated risks of surgery would be relatively low there remain significant risks including but not limited to risk of death, stroke or other neurologic complication, myocardial infarction, congestive heart failure, respiratory failure, renal failure, bleeding requiring transfusion and/or reexploration, arrhythmia, heart block or bradycardia requiring permanent pacemaker insertion, infection or other wound complications, pneumonia, pleural and/or pericardial effusion, pulmonary embolus, aortic dissection or other major vascular complication, or other immediate or delayed complications related to valve repair or replacement including but not limited to recurrent or persistent mitral regurgitation and/or mitral stenosis, LV outflow tract obstruction, aortic insufficiency, paravalvular leak, posterior AV groove disruption, structural valve deterioration and failure, thrombosis, embolization, or endocarditis.  Specific risks potentially related to the minimally-invasive approach were discussed at length, including but not limited to risk of conversion to full or partial sternotomy, aortic dissection or other major vascular complication, unilateral acute lung injury or pulmonary edema, phrenic nerve dysfunction or paralysis, rib fracture, chronic pain, lung hernia, or lymphocele. All of his questions have been answered.  The patient is interested in proceeding with elective mitral valve repair at some point in the near future but desires to hold off until the end of March if possible.  Under the circumstances I think this is more than reasonable.  He will need to undergo diagnostic  cardiac catheterization and we will contact Dr. Kennon Holter office to arrange for catheterization when practical.  The patient will also undergo CT angiography to evaluate the feasibility of peripheral cannulation for surgery.  He will return to our office at some point in early March for follow-up at which time we will make final plans  for surgical intervention.  He will call and return sooner should significant changes in his clinical symptoms develop.    I spent in excess of 90 minutes during the conduct of this office consultation and >50% of this time involved direct face-to-face encounter with the patient for counseling and/or coordination of their care.    Valentina Gu. Roxy Manns, MD 10/30/2020 11:27 AM

## 2020-11-10 ENCOUNTER — Telehealth: Payer: Self-pay

## 2020-11-11 NOTE — Progress Notes (Signed)
Touched base with Ricky Fitzgerald. He is unable to schedule R/L heart cath today, he is awaiting his work schedule for Jan and Feb. Ricky Fitzgerald says he will follow up with me as soon as he has that schedule. Answered all of Ricky Fitzgerald question pertaining to pre-cath testing and cath lab procedure.

## 2020-11-26 ENCOUNTER — Ambulatory Visit
Admission: RE | Admit: 2020-11-26 | Discharge: 2020-11-26 | Disposition: A | Discharge status: Home / Self Care | Payer: Medicare Other | Source: Ambulatory Visit | Attending: Thoracic Surgery (Cardiothoracic Vascular Surgery) | Admitting: Thoracic Surgery (Cardiothoracic Vascular Surgery)

## 2020-11-26 DIAGNOSIS — I340 Nonrheumatic mitral (valve) insufficiency: Secondary | ICD-10-CM

## 2020-11-26 DIAGNOSIS — Z01818 Encounter for other preprocedural examination: Secondary | ICD-10-CM

## 2020-11-26 DIAGNOSIS — I7103 Dissection of thoracoabdominal aorta: Secondary | ICD-10-CM

## 2020-11-26 MED ORDER — IOPAMIDOL (ISOVUE-370) INJECTION 76%
75.0000 mL | Freq: Once | INTRAVENOUS | Status: AC | PRN
  Administered 2020-11-26: 75 mL via INTRAVENOUS

## 2020-11-27 ENCOUNTER — Telehealth: Payer: Self-pay | Admitting: Cardiovascular Disease

## 2020-11-27 NOTE — Telephone Encounter (Signed)
    Pt would like to speak with Carma Lair, he said he is ready to schedule his procedure

## 2020-11-28 ENCOUNTER — Other Ambulatory Visit: Payer: Self-pay | Admitting: Thoracic Surgery (Cardiothoracic Vascular Surgery)

## 2020-11-28 DIAGNOSIS — C787 Secondary malignant neoplasm of liver and intrahepatic bile duct: Secondary | ICD-10-CM

## 2020-12-05 NOTE — Telephone Encounter (Signed)
Spoke with pt on the phone after receiving phone note that he is ready to schedule OV and right and left heart cath.  Pt states that he will call back to schedule all of this closer to the end of March.  He would like to take FMLA in April and intends to have the heart cath and hopefully the surgery within close proximity of one another.  Instructed pt that an OV at the cardiology office will be necessary within 30 days of the heart cath. Pt verbalizes understanding and is appreciative of the phone call.

## 2020-12-23 ENCOUNTER — Other Ambulatory Visit: Payer: Self-pay

## 2020-12-23 ENCOUNTER — Ambulatory Visit
Admission: RE | Admit: 2020-12-23 | Discharge: 2020-12-23 | Disposition: A | Discharge status: Home / Self Care | Payer: Medicare Other | Source: Ambulatory Visit | Attending: Thoracic Surgery (Cardiothoracic Vascular Surgery) | Admitting: Thoracic Surgery (Cardiothoracic Vascular Surgery)

## 2020-12-23 DIAGNOSIS — C787 Secondary malignant neoplasm of liver and intrahepatic bile duct: Secondary | ICD-10-CM

## 2020-12-23 MED ORDER — GADOBENATE DIMEGLUMINE 529 MG/ML IV SOLN
19.0000 mL | Freq: Once | INTRAVENOUS | Status: AC | PRN
  Administered 2020-12-23: 19 mL via INTRAVENOUS

## 2020-12-25 NOTE — Telephone Encounter (Signed)
Late entry from 12/05/20 at 4:32pm Spoke with pt on the phone after receiving phone note that he is ready to schedule OV and right and left heart cath.  Pt states that he will call back to schedule all of this closer to the end of March.  He would like to take FMLA in April and intends to have the heart cath and hopefully the surgery within close proximity of one another.  Instructed pt that an OV at the cardiology office will be necessary within 30 days of the heart cath. Pt verbalizes understanding and is appreciative of the phone call.

## 2020-12-26 ENCOUNTER — Other Ambulatory Visit: Payer: Self-pay

## 2020-12-26 ENCOUNTER — Telehealth: Payer: Self-pay

## 2020-12-26 ENCOUNTER — Encounter: Payer: Self-pay | Admitting: Cardiovascular Disease

## 2020-12-26 ENCOUNTER — Ambulatory Visit (INDEPENDENT_AMBULATORY_CARE_PROVIDER_SITE_OTHER): Payer: Medicare Other | Admitting: Cardiovascular Disease

## 2020-12-26 ENCOUNTER — Encounter: Payer: Self-pay | Admitting: *Deleted

## 2020-12-26 ENCOUNTER — Other Ambulatory Visit: Payer: Self-pay | Admitting: *Deleted

## 2020-12-26 VITALS — BP 140/90 | HR 53 | Ht 70.0 in | Wt 201.0 lb

## 2020-12-26 DIAGNOSIS — I340 Nonrheumatic mitral (valve) insufficiency: Secondary | ICD-10-CM | POA: Diagnosis not present

## 2020-12-26 DIAGNOSIS — I341 Nonrheumatic mitral (valve) prolapse: Secondary | ICD-10-CM

## 2020-12-26 NOTE — Progress Notes (Signed)
Ricky Fitzgerald returns today for follow-up.  He had a TEE performed by Dr. 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  He did see Dr. Roxy Manns 10/30/2020 who thought he was a good candidate for minimally invasive mitral valve repair.  He is already had CTA and MRI.  He is totally asymptomatic and is planning to go out west to [facility] for a medical conference and skiing with his son.  We will arrange elective outpatient right left heart cath (radial/brachial) sometime end of March/beginning of April after which she can have his mitral valve repair surgery.  Lorretta Harp, M.D., [place], [facility], [place]verta [place] [address] [place], [place]  [address]  [phone] 12/26/2020 9:37 AM

## 2020-12-26 NOTE — Patient Instructions (Signed)
Medication Instructions:  Your physician recommends that you continue on your current medications as directed. Please refer to the Current Medication list given to you today.  *If you need a refill on your cardiac medications before your next appointment, please call your pharmacy*   Follow-Up: At [facility], you and your health needs are our priority.  As part of our continuing mission to provide you with exceptional heart care, we have created designated Provider Care Teams.  These Care Teams include your primary Cardiologist (physician) and Advanced Practice Providers (APPs -  Physician Assistants and Nurse Practitioners) who all work together to provide you with the care you need, when you need it.  We recommend signing up for the patient portal called "MyChart".  Sign up information is provided on this After Visit Summary.  MyChart is used to connect with patients for Virtual Visits (Telemedicine).  Patients are able to view lab/test results, encounter notes, upcoming appointments, etc.  Non-urgent messages can be sent to your provider as well.   To learn more about what you can do with MyChart, go to [email or web address].    Your next appointment:   TBD

## 2020-12-26 NOTE — Telephone Encounter (Signed)
Mr. Hopkin called back to schedule follow up appointment with APP in preparation for his R/L heart cath. Pt scheduled to see Coletta Memos, NP on 02/13/21. Pt to have heart cath scheduled for 02/19/21. Pt already has covid test scheduled for 02/17/21.  Pt is scheduled to have minimally invasive mitral valve repair with Dr. Roxy Manns on 02/25/21.

## 2021-02-02 ENCOUNTER — Encounter: Payer: Medicare Other | Admitting: Thoracic Surgery (Cardiothoracic Vascular Surgery)

## 2021-02-09 ENCOUNTER — Encounter: Payer: Medicare Other | Admitting: Thoracic Surgery (Cardiothoracic Vascular Surgery)

## 2021-02-11 NOTE — H&P (View-Only) (Signed)
Cardiology Clinic Note   Patient Name: Ricky Fitzgerald Date of Encounter: 02/13/2021  Primary Care Provider:  Velna Hatchet, MD Primary Cardiologist:  Quay Burow, MD  Patient Profile    Ricky Fitzgerald 70 year old male presents to the clinic today for follow-up evaluation of his coronary artery disease, precatheterization, and severe mitral valve regurgitation.  Past Medical History    Past Medical History:  Diagnosis Date  . Hyperlipidemia   . Hypertension   . Trauma    Past Surgical History:  Procedure Laterality Date  . left wrist fx    . PROSTATE SURGERY    . rotator cuff sur  1990  . 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  He was seen by Dr. Gwenlyn Found on 09/23/2020.  During that time he denied chest pain or shortness of breath.  He reported he was very active riding his bike and skiing in the winter.  He has been initially referred due to cardiac murmur.  Is also noted to have thoracic aortic aneurysm measuring 41 mm.  He was completely asymptomatic at the time.  He presents the clinic today for follow-up evaluation and precardiac catheterization visit.  He states he continues to be physically active.  He just completed a multiple day ski trip out [name].  He continues to ride his exercise bike at home frequently.  He reports that his murmur was identified incidentally by his PCP during a regular physical.  He has noticed over the last 2 years his exercise tolerance has not been quite as robust.  We discussed the risks and benefits of cardiac catheterization.  He agrees to proceed.  We discussed the  expectations surrounding cardiac catheterization.  I will order a CBC, CMP, cardiac catheterization, and have him follow-up after his mitral valve repair.  Today he denies chest pain, increased shortness of breath, lower extremity edema, fatigue, palpitations, melena, hematuria, hemoptysis, diaphoresis, weakness, presyncope, syncope, orthopnea, and PND.   Home Medications    Prior to Admission medications   Medication Sig Start Date End Date Taking? Authorizing Provider  augmented betamethasone dipropionate (DIPROLENE-AF) 0.05 % cream Apply 1 application topically daily as needed (irritation). 09/03/20   [provider]  Cholecalciferol (VITAMIN D-3) 125 MCG (5000 UT) TABS Take 5,000 tablets by mouth in the morning and at bedtime.    [provider]  Coenzyme Q10 100 MG TABS Take 100 mg by mouth daily.    [provider]  desonide (DESOWEN) 0.05 % ointment Apply 1 application topically daily as needed (irritation). 09/03/20   [provider]  dutasteride (AVODART) 0.5 MG capsule Take 0.5 mg by mouth daily.    [provider]  multivitamin [facility]) per tablet Take 1 tablet by mouth daily.    [provider]  Red Yeast Rice Extract (RED YEAST RICE PO) Take 1 tablet by mouth daily at 12 noon.    [provider]  telmisartan-hydrochlorothiazide (MICARDIS HCT) 80-12.5 MG per tablet Take 1 tablet by mouth daily. 05/28/13   Ricard Dillon, MD    Family History    Family History  Adopted: Yes   is adopted.   Social History  Social History   Socioeconomic History  . Marital status: Divorced    Spouse name: Not on file  . Number of children: Not on file  . Years of education: Not on file  . Highest education level: Not on file  Occupational History  . Occupation: Surveyor, minerals  Tobacco Use  . Smoking status: Never Smoker  . Smokeless tobacco: Never Used  Substance and Sexual Activity  . Alcohol use: No  . Drug  use: No  . Sexual activity: Yes  Other Topics Concern  . Not on file  Social History Narrative  . Not on file   Social Determinants of Health   Financial Resource Strain: Not on file  Food Insecurity: Not on file  Transportation Needs: Not on file  Physical Activity: Not on file  Stress: Not on file  Social Connections: Not on file  Intimate Partner Violence: Not on file     Review of Systems    General:  No chills, fever, night sweats or weight changes.  Cardiovascular:  No chest pain, dyspnea on exertion, edema, orthopnea, palpitations, paroxysmal nocturnal dyspnea. Dermatological: No rash, lesions/masses Respiratory: No cough, dyspnea Urologic: No hematuria, dysuria Abdominal:   No nausea, vomiting, diarrhea, bright red blood per rectum, melena, or hematemesis Neurologic:  No visual changes, wkns, changes in mental status. All other systems reviewed and are otherwise negative except as noted above.  Physical Exam    VS:  BP (!) 138/98 (BP Location: Left Arm, Patient Position: Sitting, Cuff Size: Normal)   Pulse (!) 59   Ht 5\' 10"  (1.778 Fitzgerald)   Wt 201 lb 12.8 oz (91.5 kg)   BMI 28.96 kg/Fitzgerald  , BMI Body mass index is 28.96 kg/Fitzgerald. GEN: Well nourished, well developed, in no acute distress. HEENT: normal. Neck: Supple, no JVD, carotid bruits, or masses. Cardiac: RRR, 3/6 high pitched systolic murmur , rubs, or gallops. No clubbing, cyanosis, edema.  Radials/DP/PT 2+ and equal bilaterally.  Respiratory:  Respirations regular and unlabored, clear to auscultation bilaterally. GI: Soft, nontender, nondistended, BS + x 4. MS: no deformity or atrophy. Skin: warm and dry, no rash. Neuro:  Strength and sensation are intact. Psych: Normal affect.  Accessory Clinical Findings    Recent Labs: 10/21/2020: BUN 18; Creatinine, Ser 1.00; Hemoglobin 15.5; Platelets 205; Potassium 3.8; Sodium 140   Recent Lipid Panel    Component Value Date/Time   CHOL 181 12/13/2012 0853   TRIG  76.0 12/13/2012 0853   TRIG 99 11/08/2006 0834   HDL 44.10 12/13/2012 0853   CHOLHDL 4 12/13/2012 0853   VLDL 15.2 12/13/2012 0853   LDLCALC 122 (H) 12/13/2012 0853   LDLDIRECT 146.9 11/21/2007 0942    ECG personally reviewed by me today-sinus bradycardia left anterior fascicular block 59 bpm- No acute changes  Echocardiogram 10/22/2020 IMPRESSIONS    1. Left ventricular ejection fraction, by estimation, is 60 to 65%. The  left ventricle has normal function.  2. Right ventricular systolic function is normal. The right ventricular  size is normal. There is normal pulmonary artery systolic pressure. The  estimated right ventricular systolic pressure is 60.4 mmHg.  3. Left atrial size was mildly dilated. No left atrial/left atrial  appendage thrombus was detected.  4. There is at least one ruptured chord to the medial scallop (P3) of the  posterior leaflet, with subsequent flail motion of the P3 scallop and a  highly eccentric regurgitant jet. Vena contracta 5 mm. Effective  regurgitant orifice area is 0.7 cm, the  regurgitant volume is 90 ml, the regurgitant fraction is 60% (By the PISA  method). Regurgitant volume is 81 ml and regurgitant fraction is 57% by  the continuity equation.. The mitral valve is myxomatous. Severe mitral  valve regurgitation. No evidence of  mitral stenosis. There is severe holosystolic prolapse of both leaflets of  the mitral valve.  5. The aortic valve is tricuspid. Aortic valve regurgitation is not  visualized. No aortic stenosis is present.  6. There is mild dilatation of the ascending aorta, measuring 41 mm.   Assessment & Plan   1.  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 Roxy Manns.  Remains physically active riding bicycle. Heart healthy low-sodium diet Order LHC/RHC CBC/CMP/Covid swab  Shared  Decision Making/Informed Consent The risks [stroke (1 in 1000), death (1 in 39), kidney failure [usually temporary] (1 in 500), bleeding (1 in 200), allergic reaction [possibly serious] (1 in 200)], benefits (diagnostic support and management of coronary artery disease) and alternatives of a cardiac catheterization were discussed in detail with Mr. Linville and he is willing to proceed.  Essential hypertension-BP today 138/98.  Well-controlled at home. Continue telmisartan, HCTZ Heart healthy low-sodium diet-salty 6 given Increase physical activity as tolerated  Hyperlipidemia- [id number] on 12/13/2012 Continue co-Q10, red yeast rice Heart healthy low-sodium high-fiber diet Increase physical activity as tolerated Follows with PCP  Disposition: Follow-up with Dr. Gwenlyn Found after minimally invasive mitral valve repair  Jossie Ng. Akeiba Axelson NP-C    02/13/2021, 2:15 PM [address] Office [phone] Fax [phone]  Notice: This dictation was prepared with Dragon dictation along with smaller phrase technology. Any transcriptional errors that result from this process are unintentional and may not be corrected upon review.  I spent 10 minutes examining this patient, reviewing medications, and using patient centered shared decision making involving her cardiac care.  Prior to her visit I spent greater than 20 minutes reviewing her past medical history,  medications, and prior cardiac tests.

## 2021-02-11 NOTE — Progress Notes (Signed)
Cardiology Clinic Note   Patient Name: Ricky Fitzgerald Date of Encounter: 02/13/2021  Primary Care Provider:  Velna Hatchet, MD Primary Cardiologist:  Quay Burow, MD  Patient Profile    Ricky Fitzgerald 70 year old male presents to the clinic today for follow-up evaluation of his coronary artery disease, precatheterization, and severe mitral valve regurgitation.  Past Medical History    Past Medical History:  Diagnosis Date  . Hyperlipidemia   . Hypertension   . Trauma    Past Surgical History:  Procedure Laterality Date  . left wrist fx    . PROSTATE SURGERY    . rotator cuff sur  1990  . 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  He was seen by Dr. Gwenlyn Found on 09/23/2020.  During that time he denied chest pain or shortness of breath.  He reported he was very active riding his bike and skiing in the winter.  He has been initially referred due to cardiac murmur.  Is also noted to have thoracic aortic aneurysm measuring 41 mm.  He was completely asymptomatic at the time.  He presents the clinic today for follow-up evaluation and precardiac catheterization visit.  He states he continues to be physically active.  He just completed a multiple day ski trip out [place].  He continues to ride his exercise bike at home frequently.  He reports that his murmur was identified incidentally by his PCP during a regular physical.  He has noticed over the last 2 years his exercise tolerance has not been quite as robust.  We discussed the risks and benefits of cardiac catheterization.  He agrees to proceed.  We discussed the  expectations surrounding cardiac catheterization.  I will order a CBC, CMP, cardiac catheterization, and have him follow-up after his mitral valve repair.  Today he denies chest pain, increased shortness of breath, lower extremity edema, fatigue, palpitations, melena, hematuria, hemoptysis, diaphoresis, weakness, presyncope, syncope, orthopnea, and PND.   Home Medications    Prior to Admission medications   Medication Sig Start Date End Date Taking? Authorizing Provider  augmented betamethasone dipropionate (DIPROLENE-AF) 0.05 % cream Apply 1 application topically daily as needed (irritation). 09/03/20   [provider]  Cholecalciferol (VITAMIN D-3) 125 MCG (5000 UT) TABS Take 5,000 tablets by mouth in the morning and at bedtime.    [provider]  Coenzyme Q10 100 MG TABS Take 100 mg by mouth daily.    [provider]  desonide (DESOWEN) 0.05 % ointment Apply 1 application topically daily as needed (irritation). 09/03/20   [provider]  dutasteride (AVODART) 0.5 MG capsule Take 0.5 mg by mouth daily.    [provider]  multivitamin [facility]) per tablet Take 1 tablet by mouth daily.    [provider]  Red Yeast Rice Extract (RED YEAST RICE PO) Take 1 tablet by mouth daily at 12 noon.    [provider]  telmisartan-hydrochlorothiazide (MICARDIS HCT) 80-12.5 MG per tablet Take 1 tablet by mouth daily. 05/28/13   Ricard Dillon, MD    Family History    Family History  Adopted: Yes   is adopted.   Social History  Social History   Socioeconomic History  . Marital status: Divorced    Spouse name: Not on file  . Number of children: Not on file  . Years of education: Not on file  . Highest education level: Not on file  Occupational History  . Occupation: Surveyor, minerals  Tobacco Use  . Smoking status: Never Smoker  . Smokeless tobacco: Never Used  Substance and Sexual Activity  . Alcohol use: No  . Drug  use: No  . Sexual activity: Yes  Other Topics Concern  . Not on file  Social History Narrative  . Not on file   Social Determinants of Health   Financial Resource Strain: Not on file  Food Insecurity: Not on file  Transportation Needs: Not on file  Physical Activity: Not on file  Stress: Not on file  Social Connections: Not on file  Intimate Partner Violence: Not on file     Review of Systems    General:  No chills, fever, night sweats or weight changes.  Cardiovascular:  No chest pain, dyspnea on exertion, edema, orthopnea, palpitations, paroxysmal nocturnal dyspnea. Dermatological: No rash, lesions/masses Respiratory: No cough, dyspnea Urologic: No hematuria, dysuria Abdominal:   No nausea, vomiting, diarrhea, bright red blood per rectum, melena, or hematemesis Neurologic:  No visual changes, wkns, changes in mental status. All other systems reviewed and are otherwise negative except as noted above.  Physical Exam    VS:  BP (!) 138/98 (BP Location: Left Arm, Patient Position: Sitting, Cuff Size: Normal)   Pulse (!) 59   Ht 5\' 10"  (1.778 Fitzgerald)   Wt 201 lb 12.8 oz (91.5 kg)   BMI 28.96 kg/Fitzgerald  , BMI Body mass index is 28.96 kg/Fitzgerald. GEN: Well nourished, well developed, in no acute distress. HEENT: normal. Neck: Supple, no JVD, carotid bruits, or masses. Cardiac: RRR, 3/6 high pitched systolic murmur , rubs, or gallops. No clubbing, cyanosis, edema.  Radials/DP/PT 2+ and equal bilaterally.  Respiratory:  Respirations regular and unlabored, clear to auscultation bilaterally. GI: Soft, nontender, nondistended, BS + x 4. MS: no deformity or atrophy. Skin: warm and dry, no rash. Neuro:  Strength and sensation are intact. Psych: Normal affect.  Accessory Clinical Findings    Recent Labs: 10/21/2020: BUN 18; Creatinine, Ser 1.00; Hemoglobin 15.5; Platelets 205; Potassium 3.8; Sodium 140   Recent Lipid Panel    Component Value Date/Time   CHOL 181 12/13/2012 0853   TRIG  76.0 12/13/2012 0853   TRIG 99 11/08/2006 0834   HDL 44.10 12/13/2012 0853   CHOLHDL 4 12/13/2012 0853   VLDL 15.2 12/13/2012 0853   LDLCALC 122 (H) 12/13/2012 0853   LDLDIRECT 146.9 11/21/2007 0942    ECG personally reviewed by me today-sinus bradycardia left anterior fascicular block 59 bpm- No acute changes  Echocardiogram 10/22/2020 IMPRESSIONS    1. Left ventricular ejection fraction, by estimation, is 60 to 65%. The  left ventricle has normal function.  2. Right ventricular systolic function is normal. The right ventricular  size is normal. There is normal pulmonary artery systolic pressure. The  estimated right ventricular systolic pressure is 78.6 mmHg.  3. Left atrial size was mildly dilated. No left atrial/left atrial  appendage thrombus was detected.  4. There is at least one ruptured chord to the medial scallop (P3) of the  posterior leaflet, with subsequent flail motion of the P3 scallop and a  highly eccentric regurgitant jet. Vena contracta 5 mm. Effective  regurgitant orifice area is 0.7 cm, the  regurgitant volume is 90 ml, the regurgitant fraction is 60% (By the PISA  method). Regurgitant volume is 81 ml and regurgitant fraction is 57% by  the continuity equation.. The mitral valve is myxomatous. Severe mitral  valve regurgitation. No evidence of  mitral stenosis. There is severe holosystolic prolapse of both leaflets of  the mitral valve.  5. The aortic valve is tricuspid. Aortic valve regurgitation is not  visualized. No aortic stenosis is present.  6. There is mild dilatation of the ascending aorta, measuring 41 mm.   Assessment & Plan   1.  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 Roxy Manns.  Remains physically active riding bicycle. Heart healthy low-sodium diet Order LHC/RHC CBC/CMP/Covid swab  Shared  Decision Making/Informed Consent The risks [stroke (1 in 1000), death (1 in 60), kidney failure [usually temporary] (1 in 500), bleeding (1 in 200), allergic reaction [possibly serious] (1 in 200)], benefits (diagnostic support and management of coronary artery disease) and alternatives of a cardiac catheterization were discussed in detail with Ricky Fitzgerald and he is willing to proceed.  Essential hypertension-BP today 138/98.  Well-controlled at home. Continue telmisartan, HCTZ Heart healthy low-sodium diet-salty 6 given Increase physical activity as tolerated  Hyperlipidemia- [id number] on 12/13/2012 Continue co-Q10, red yeast rice Heart healthy low-sodium high-fiber diet Increase physical activity as tolerated Follows with PCP  Disposition: Follow-up with Dr. Gwenlyn Found after minimally invasive mitral valve repair  Jossie Ng. Thadeus Gandolfi NP-C    02/13/2021, 2:15 PM [address] Office [phone] Fax [phone]  Notice: This dictation was prepared with Dragon dictation along with smaller phrase technology. Any transcriptional errors that result from this process are unintentional and may not be corrected upon review.  I spent 10 minutes examining this patient, reviewing medications, and using patient centered shared decision making involving her cardiac care.  Prior to her visit I spent greater than 20 minutes reviewing her past medical history,  medications, and prior cardiac tests.

## 2021-02-13 ENCOUNTER — Encounter: Payer: Self-pay | Admitting: General Practice

## 2021-02-13 ENCOUNTER — Other Ambulatory Visit: Payer: Self-pay

## 2021-02-13 ENCOUNTER — Encounter: Payer: Self-pay | Admitting: Thoracic Surgery (Cardiothoracic Vascular Surgery)

## 2021-02-13 ENCOUNTER — Ambulatory Visit (INDEPENDENT_AMBULATORY_CARE_PROVIDER_SITE_OTHER): Payer: Medicare Other | Admitting: General Practice

## 2021-02-13 VITALS — BP 138/98 | HR 59 | Ht 70.0 in | Wt 201.8 lb

## 2021-02-13 DIAGNOSIS — I340 Nonrheumatic mitral (valve) insufficiency: Secondary | ICD-10-CM

## 2021-02-13 DIAGNOSIS — R0600 Dyspnea, unspecified: Secondary | ICD-10-CM

## 2021-02-13 DIAGNOSIS — E782 Mixed hyperlipidemia: Secondary | ICD-10-CM | POA: Diagnosis not present

## 2021-02-13 DIAGNOSIS — Z01810 Encounter for preprocedural cardiovascular examination: Secondary | ICD-10-CM | POA: Diagnosis not present

## 2021-02-13 DIAGNOSIS — R0609 Other forms of dyspnea: Secondary | ICD-10-CM

## 2021-02-13 MED ORDER — SODIUM CHLORIDE 0.9% FLUSH: 3.0000 mL | Freq: Two times a day (BID) | INTRAVENOUS | Status: DC

## 2021-02-13 NOTE — Telephone Encounter (Signed)
Spoke with Ricky Fitzgerald on the phone regarding questions about upcoming cath procedure. All questions answered for pt. Pt verbalizes understanding and is grateful for the phone call.

## 2021-02-13 NOTE — Patient Instructions (Signed)
   You are scheduled for a Cardiac Catheterization on Thursday, March 31 with Dr. Quay Burow.  1. Please arrive at the [facility] (Main Entrance A) at [facility]: [address] [place], [place] [address] at 9:30 AM (This time is two hours before your procedure to ensure your preparation). Free valet parking service is available.   Special note: Every effort is made to have your procedure done on time. Please understand that emergencies sometimes delay scheduled procedures.  2. Diet: Do not eat solid foods after midnight.  The patient may have clear liquids until 5am upon the day of the procedure.  You will need a COVID-19  test prior to your procedure. You are scheduled for Monday 02-13-2021 at 8:50AM/PM. This is a Drive Up Visit at [address] [place], [place] [address]. Someone will direct you to the appropriate testing line. Stay in your car and someone will be with you shortly.   3. Labs: You will need to have blood drawn on Friday, March 25 at [facility]  Open: 8am - 5pm (Lunch 12:30 - 1:30)   Phone: [phone]. You do not need to be fasting.  4. Medication instructions in preparation for your procedure:  On the morning of your procedure, take your Aspirin and any morning medicines NOT listed above.  You may use sips of water.  5. Plan for one night stay--bring personal belongings. 6. Bring a current list of your medications and current insurance cards. 7. You MUST have a responsible person to drive you home. 8. Someone MUST be with you the first 24 hours after you arrive home or your discharge will be delayed. 9. Please wear clothes that are easy to get on and off and wear slip-on shoes.  Thank you for allowing [place] to care for you!   -- [facility]

## 2021-02-14 LAB — BASIC METABOLIC PANEL
BUN/Creatinine Ratio: 20 (ref 10–24)
BUN: 21 mg/dL (ref 8–27)
CO2: 23 mmol/L (ref 20–29)
Calcium: 9.7 mg/dL (ref 8.6–10.2)
Chloride: 97 mmol/L (ref 96–106)
Creatinine, Ser: 1.05 mg/dL (ref 0.76–1.27)
Glucose: 79 mg/dL (ref 65–99)
Potassium: 3.7 mmol/L (ref 3.5–5.2)
Sodium: 138 mmol/L (ref 134–144)
eGFR: 77 mL/min/{1.73_m2} (ref >59)

## 2021-02-14 LAB — CBC
Hematocrit: 45.1 % (ref 37.5–51.0)
Hemoglobin: 15.8 g/dL (ref 13.0–17.7)
MCH: 30.8 pg (ref 26.6–33.0)
MCHC: 35 g/dL (ref 31.5–35.7)
MCV: 88 fL (ref 79–97)
Platelets: 254 10*3/uL (ref 150–450)
RBC: 5.13 x10E6/uL (ref 4.14–5.80)
RDW: 12.8 % (ref 11.6–15.4)
WBC: 11.4 10*3/uL — ABNORMAL HIGH (ref 3.4–10.8)

## 2021-02-16 ENCOUNTER — Other Ambulatory Visit (HOSPITAL_COMMUNITY): Payer: Medicare Other

## 2021-02-17 ENCOUNTER — Telehealth: Payer: Self-pay | Admitting: *Deleted

## 2021-02-17 NOTE — Telephone Encounter (Signed)
No answer, voicemail message. 

## 2021-02-17 NOTE — Telephone Encounter (Addendum)
Pt contacted pre-catheterization scheduled at [facility] for: Thursday February 19, 2021 11:30 AM Verified arrival time and place: [facility]) at: 9:30 AM   No solid food after midnight prior to cath, clear liquids until 5 AM day of procedure.  Hold: Telmisartan-HCT-AM of procedure  Except hold medications AM meds can be  taken pre-cath with sips of water including: ASA 81 mg   Confirmed patient has responsible adult to drive home post procedure and be with patient first 24 hours after arriving home: yes  You are allowed ONE visitor in the waiting room during the time you are at the hospital for your procedure. Both you and your visitor must wear a mask once you enter the hospital.    Reviewed procedure/mask/visitor instructions with patient.

## 2021-02-18 ENCOUNTER — Other Ambulatory Visit (HOSPITAL_COMMUNITY)
Admission: RE | Admit: 2021-02-18 | Discharge: 2021-02-18 | Disposition: A | Discharge status: Home / Self Care | Payer: Medicare Other | Source: Ambulatory Visit | Attending: Cardiovascular Disease | Admitting: Cardiovascular Disease

## 2021-02-18 ENCOUNTER — Other Ambulatory Visit (HOSPITAL_COMMUNITY): Payer: Medicare Other

## 2021-02-18 DIAGNOSIS — Z20822 Contact with and (suspected) exposure to covid-19: Secondary | ICD-10-CM | POA: Diagnosis not present

## 2021-02-18 DIAGNOSIS — Z01812 Encounter for preprocedural laboratory examination: Secondary | ICD-10-CM | POA: Insufficient documentation

## 2021-02-18 LAB — SARS CORONAVIRUS 2 (TAT 6-24 HRS): SARS Coronavirus 2: NEGATIVE

## 2021-02-18 NOTE — Telephone Encounter (Signed)
Reviewed procedure/mask/visitor instructions with patient. 

## 2021-02-18 NOTE — Telephone Encounter (Signed)
Patient is returning call to discuss procedure instructions.

## 2021-02-18 NOTE — Telephone Encounter (Signed)
LMTCB to review procedure instructions with patient.

## 2021-02-19 ENCOUNTER — Encounter (HOSPITAL_COMMUNITY)
Admission: RE | Disposition: A | Discharge status: Home / Self Care | Payer: Self-pay | Source: Home / Self Care | Attending: Cardiovascular Disease

## 2021-02-19 ENCOUNTER — Other Ambulatory Visit: Payer: Self-pay

## 2021-02-19 ENCOUNTER — Ambulatory Visit (HOSPITAL_COMMUNITY)
Admission: RE | Admit: 2021-02-19 | Discharge: 2021-02-19 | Disposition: A | Discharge status: Home / Self Care | Payer: Medicare Other | Attending: Cardiovascular Disease | Admitting: Cardiovascular Disease

## 2021-02-19 DIAGNOSIS — I340 Nonrheumatic mitral (valve) insufficiency: Secondary | ICD-10-CM | POA: Diagnosis present

## 2021-02-19 DIAGNOSIS — R0600 Dyspnea, unspecified: Secondary | ICD-10-CM

## 2021-02-19 DIAGNOSIS — R0609 Other forms of dyspnea: Secondary | ICD-10-CM

## 2021-02-19 DIAGNOSIS — I2510 Atherosclerotic heart disease of native coronary artery without angina pectoris: Secondary | ICD-10-CM | POA: Insufficient documentation

## 2021-02-19 DIAGNOSIS — Z79899 Other long term (current) drug therapy: Secondary | ICD-10-CM | POA: Diagnosis not present

## 2021-02-19 DIAGNOSIS — I10 Essential (primary) hypertension: Secondary | ICD-10-CM | POA: Insufficient documentation

## 2021-02-19 DIAGNOSIS — E785 Hyperlipidemia, unspecified: Secondary | ICD-10-CM | POA: Insufficient documentation

## 2021-02-19 HISTORY — PX: RIGHT/LEFT HEART CATH AND CORONARY ANGIOGRAPHY: CATH118266

## 2021-02-19 LAB — POCT I-STAT EG7
Acid-Base Excess: 1 mmol/L (ref 0.0–2.0)
Acid-Base Excess: 2 mmol/L (ref 0.0–2.0)
Acid-Base Excess: 3 mmol/L — ABNORMAL HIGH (ref 0.0–2.0)
Bicarbonate: 26 mmol/L (ref 20.0–28.0)
Bicarbonate: 26.3 mmol/L (ref 20.0–28.0)
Bicarbonate: 27.9 mmol/L (ref 20.0–28.0)
Calcium, Ion: 1.19 mmol/L (ref 1.15–1.40)
Calcium, Ion: 1.2 mmol/L (ref 1.15–1.40)
Calcium, Ion: 1.2 mmol/L (ref 1.15–1.40)
HCT: 43 % (ref 39.0–52.0)
HCT: 43 % (ref 39.0–52.0)
HCT: 43 % (ref 39.0–52.0)
Hemoglobin: 14.6 g/dL (ref 13.0–17.0)
Hemoglobin: 14.6 g/dL (ref 13.0–17.0)
Hemoglobin: 14.6 g/dL (ref 13.0–17.0)
O2 Saturation: 66 %
O2 Saturation: 71 %
O2 Saturation: 72 %
Potassium: 3.5 mmol/L (ref 3.5–5.1)
Potassium: 3.5 mmol/L (ref 3.5–5.1)
Potassium: 3.5 mmol/L (ref 3.5–5.1)
Sodium: 140 mmol/L (ref 135–145)
Sodium: 140 mmol/L (ref 135–145)
Sodium: 141 mmol/L (ref 135–145)
TCO2: 27 mmol/L (ref 22–32)
TCO2: 28 mmol/L (ref 22–32)
TCO2: 29 mmol/L (ref 22–32)
pCO2, Ven: 38.2 mmHg — ABNORMAL LOW (ref 44.0–60.0)
pCO2, Ven: 41.7 mmHg — ABNORMAL LOW (ref 44.0–60.0)
pCO2, Ven: 42.4 mmHg — ABNORMAL LOW (ref 44.0–60.0)
pH, Ven: 7.4 (ref 7.250–7.430)
pH, Ven: 7.433 — ABNORMAL HIGH (ref 7.250–7.430)
pH, Ven: 7.441 — ABNORMAL HIGH (ref 7.250–7.430)
pO2, Ven: 34 mmHg (ref 32.0–45.0)
pO2, Ven: 36 mmHg (ref 32.0–45.0)
pO2, Ven: 37 mmHg (ref 32.0–45.0)

## 2021-02-19 LAB — POCT I-STAT 7, (LYTES, BLD GAS, ICA,H+H)
Acid-Base Excess: 2 mmol/L (ref 0.0–2.0)
Bicarbonate: 24.7 mmol/L (ref 20.0–28.0)
Calcium, Ion: 1.13 mmol/L — ABNORMAL LOW (ref 1.15–1.40)
HCT: 41 % (ref 39.0–52.0)
Hemoglobin: 13.9 g/dL (ref 13.0–17.0)
O2 Saturation: 99 %
Potassium: 3.3 mmol/L — ABNORMAL LOW (ref 3.5–5.1)
Sodium: 140 mmol/L (ref 135–145)
TCO2: 26 mmol/L (ref 22–32)
pCO2 arterial: 31.5 mmHg — ABNORMAL LOW (ref 32.0–48.0)
pH, Arterial: 7.503 — ABNORMAL HIGH (ref 7.350–7.450)
pO2, Arterial: 131 mmHg — ABNORMAL HIGH (ref 83.0–108.0)

## 2021-02-19 SURGERY — RIGHT/LEFT HEART CATH AND CORONARY ANGIOGRAPHY
Anesthesia: LOCAL

## 2021-02-19 MED ORDER — SODIUM CHLORIDE 0.9 % IV SOLN: 250.0000 mL | INTRAVENOUS | Status: DC | PRN

## 2021-02-19 MED ORDER — SODIUM CHLORIDE 0.9% FLUSH: 3.0000 mL | INTRAVENOUS | Status: DC | PRN

## 2021-02-19 MED ORDER — ASPIRIN 81 MG PO CHEW: 81.0000 mg | CHEWABLE_TABLET | ORAL | Status: DC

## 2021-02-19 MED ORDER — HEPARIN (PORCINE) IN NACL 1000-0.9 UT/500ML-% IV SOLN
INTRAVENOUS | Status: AC
  Filled 2021-02-19: qty 1000

## 2021-02-19 MED ORDER — LIDOCAINE HCL (PF) 1 % IJ SOLN
INTRAMUSCULAR | Status: DC | PRN
  Administered 2021-02-19: 4 mL

## 2021-02-19 MED ORDER — HEPARIN SODIUM (PORCINE) 1000 UNIT/ML IJ SOLN
INTRAMUSCULAR | Status: DC | PRN
  Administered 2021-02-19: 4500 [IU] via INTRAVENOUS

## 2021-02-19 MED ORDER — HEPARIN (PORCINE) IN NACL 1000-0.9 UT/500ML-% IV SOLN
INTRAVENOUS | Status: DC | PRN
  Administered 2021-02-19 (×2): 500 mL

## 2021-02-19 MED ORDER — VERAPAMIL HCL 2.5 MG/ML IV SOLN
INTRAVENOUS | Status: AC
  Filled 2021-02-19: qty 2

## 2021-02-19 MED ORDER — LIDOCAINE HCL (PF) 1 % IJ SOLN
INTRAMUSCULAR | Status: AC
  Filled 2021-02-19: qty 30

## 2021-02-19 MED ORDER — VERAPAMIL HCL 2.5 MG/ML IV SOLN
INTRA_ARTERIAL | Status: DC | PRN
  Administered 2021-02-19: 5 mL via INTRA_ARTERIAL

## 2021-02-19 MED ORDER — NITROGLYCERIN 1 MG/10 ML FOR IR/CATH LAB
INTRA_ARTERIAL | Status: AC
  Filled 2021-02-19: qty 10

## 2021-02-19 MED ORDER — SODIUM CHLORIDE 0.9 % WEIGHT BASED INFUSION: 1.0000 mL/kg/h | INTRAVENOUS | Status: DC

## 2021-02-19 MED ORDER — HEPARIN SODIUM (PORCINE) 1000 UNIT/ML IJ SOLN
INTRAMUSCULAR | Status: AC
  Filled 2021-02-19: qty 1

## 2021-02-19 MED ORDER — SODIUM CHLORIDE 0.9 % WEIGHT BASED INFUSION
3.0000 mL/kg/h | INTRAVENOUS | Status: AC
  Administered 2021-02-19: 3 mL/kg/h via INTRAVENOUS

## 2021-02-19 MED ORDER — IOHEXOL 350 MG/ML SOLN
INTRAVENOUS | Status: DC | PRN
  Administered 2021-02-19: 60 mL

## 2021-02-19 SURGICAL SUPPLY — 14 items
CATH INFINITI 5FR ANG PIGTAIL (CATHETERS) ×2 IMPLANT
CATH INFINITI JR4 5F (CATHETERS) ×2 IMPLANT
CATH OPTITORQUE TIG 4.0 5F (CATHETERS) ×2 IMPLANT
CATH SWAN GANZ 7F STRAIGHT (CATHETERS) ×2 IMPLANT
DEVICE RAD COMP TR BAND LRG (VASCULAR PRODUCTS) ×2 IMPLANT
GLIDESHEATH SLEND A-KIT 6F 22G (SHEATH) ×2 IMPLANT
GLIDESHEATH SLENDER 7FR .021G (SHEATH) ×2 IMPLANT
GUIDEWIRE INQWIRE 1.5J.035X260 (WIRE) ×1 IMPLANT
INQWIRE 1.5J .035X260CM (WIRE) ×2
KIT HEART LEFT (KITS) ×2 IMPLANT
PACK CARDIAC CATHETERIZATION (CUSTOM PROCEDURE TRAY) ×2 IMPLANT
TRANSDUCER W/STOPCOCK (MISCELLANEOUS) ×2 IMPLANT
TUBING CIL FLEX 10 FLL-RA (TUBING) ×2 IMPLANT
WIRE HI TORQ VERSACORE-J 145CM (WIRE) ×2 IMPLANT

## 2021-02-19 NOTE — Interval H&P Note (Signed)
Cath Lab Visit (complete for each Cath Lab visit)  Clinical Evaluation Leading to the Procedure:   ACS: No.  Non-ACS:    Anginal Classification: No Symptoms  Anti-ischemic medical therapy: No Therapy  Non-Invasive Test Results: No non-invasive testing performed  Prior CABG: No previous CABG      History and Physical Interval Note:  02/19/2021 1:21 PM  Ricky Fitzgerald  has presented today for surgery, with the diagnosis of mitral valve evaluation.  The various methods of treatment have been discussed with the patient and family. After consideration of risks, benefits and other options for treatment, the patient has consented to  Procedure(s): RIGHT/LEFT HEART CATH AND CORONARY ANGIOGRAPHY (N/A) as a surgical intervention.  The patient's history has been reviewed, patient examined, no change in status, stable for surgery.  I have reviewed the patient's chart and labs.  Questions were answered to the patient's satisfaction.     Quay Burow

## 2021-02-19 NOTE — Discharge Instructions (Signed)
DRINK PLENTY OF FLUIDS OVER THE NEXT 2-3 DAYS.  Radial Site Care  This sheet gives you information about how to care for yourself after your procedure. Your health care provider may also give you more specific instructions. If you have problems or questions, contact your health care provider. What can I expect after the procedure? After the procedure, it is common to have:  Bruising and tenderness at the catheter insertion area. Follow these instructions at home: Medicines  Take over-the-counter and prescription medicines only as told by your health care provider. Insertion site care  Follow instructions from your health care provider about how to take care of your insertion site. Make sure you: ? Wash your hands with soap and water before you change your bandage (dressing). If soap and water are not available, use hand sanitizer. ? Change your dressing as told by your health care provider. ? Leave stitches (sutures), skin glue, or adhesive strips in place. These skin closures may need to stay in place for 2 weeks or longer. If adhesive strip edges start to loosen and curl up, you may trim the loose edges. Do not remove adhesive strips completely unless your health care provider tells you to do that.  Check your insertion site every day for signs of infection. Check for: ? Redness, swelling, or pain. ? Fluid or blood. ? Pus or a bad smell. ? Warmth.  Do not take baths, swim, or use a hot tub until your health care provider approves.  You may shower 24-48 hours after the procedure, or as directed by your health care provider. ? Remove the dressing and gently wash the site with plain soap and water. ? Pat the area dry with a clean towel. ? Do not rub the site. That could cause bleeding.  Do not apply powder or lotion to the site. Activity  For 24 hours after the procedure, or as directed by your health care provider: ? Do not flex or bend the affected arm. ? Do not push or pull  heavy objects with the affected arm. ? Do not drive yourself home from the hospital or clinic. You may drive 24 hours after the procedure unless your health care provider tells you not to. ? Do not operate machinery or power tools.  Do not lift anything that is heavier than 10 lb (4.5 kg), or the limit that you are told, until your health care provider says that it is safe.  Ask your health care provider when it is okay to: ? Return to work or school. ? Resume usual physical activities or sports. ? Resume sexual activity.   General instructions  If the catheter site starts to bleed, raise your arm and put firm pressure on the site. If the bleeding does not stop, get help right away. This is a medical emergency.  If you went home on the same day as your procedure, a responsible adult should be with you for the first 24 hours after you arrive home.  Keep all follow-up visits as told by your health care provider. This is important. Contact a health care provider if:  You have a fever.  You have redness, swelling, or yellow drainage around your insertion site. Get help right away if:  You have unusual pain at the radial site.  The catheter insertion area swells very fast.  The insertion area is bleeding, and the bleeding does not stop when you hold steady pressure on the area.  Your arm or hand becomes pale,   cool, tingly, or numb. These symptoms may represent a serious problem that is an emergency. Do not wait to see if the symptoms will go away. Get medical help right away. Call your local emergency services (911 in the [place] Do not drive yourself to the hospital. Summary  After the procedure, it is common to have bruising and tenderness at the site.  Follow instructions from your health care provider about how to take care of your radial site wound. Check the wound every day for signs of infection.  Do not lift anything that is heavier than 10 lb (4.5 kg), or the limit that you  are told, until your health care provider says that it is safe. This information is not intended to replace advice given to you by your health care provider. Make sure you discuss any questions you have with your health care provider. Document Revised: 12/14/2017 Document Reviewed: 12/14/2017 Elsevier Patient Education  2021 [employer]  

## 2021-02-20 ENCOUNTER — Encounter (HOSPITAL_COMMUNITY): Payer: Self-pay | Admitting: Cardiovascular Disease

## 2021-02-20 NOTE — Progress Notes (Signed)
Surgical Instructions    Your procedure is scheduled on Wednesday 02/25/2021.  Report to [facility] Main Entrance "A" at 06:30 A.M., then check in with the Admitting office.   Call this number if you have problems the morning of surgery:  [phone]    If you have any questions prior to your surgery date call [phone]: Open Monday-Friday 8am-4pm    Remember:  Do not eat or drink after midnight the night before your surgery   Take these medicines the morning of surgery with A SIP OF WATER: NONE   As of today, STOP taking any aspirin-containing products (unless otherwise instructed by your surgeon), Aleve, Naproxen, Ibuprofen, Motrin, Advil, Goody's, BC's, all herbal medications, supplements, fish oil, and all vitamins.  Follow your surgeon's instructions on when to stop Aspirin.  If no instructions were given by your surgeon then you will need to call the office to get those instructions.            Do NOT Smoke (Tobacco/Vaping) or drink Alcohol 24 hours prior to your procedure  If you use a CPAP at night, you may bring all equipment for your overnight stay.   Contacts, glasses, hearing aids, dentures or partials may not be worn into surgery, please bring cases for these belongings   For patients admitted to the hospital, discharge time will be determined by your treatment team.   Patients discharged the day of surgery will not be allowed to drive home, and someone needs to stay with them for 24 hours.    Special instructions:   [place]- Preparing For Surgery  Before surgery, you can play an important role. Because skin is not sterile, your skin needs to be as free of germs as possible. You can reduce the number of germs on your skin by washing with CHG (chlorahexidine gluconate) Soap before surgery.  CHG is an antiseptic cleaner which kills germs and bonds with the skin to continue killing germs even after washing.    Oral Hygiene is also important to reduce your risk  of infection.  Remember - BRUSH YOUR TEETH THE MORNING OF SURGERY WITH YOUR REGULAR TOOTHPASTE  Please do not use if you have an allergy to CHG or antibacterial soaps. If your skin becomes reddened/irritated stop using the CHG.  Do not shave (including legs and underarms) for at least 48 hours prior to first CHG shower. It is OK to shave your face.  Please follow these instructions carefully.   You are going to shower with CHG soap 2 different times.  The NIGHT BEFORE SURGERY/PROCEDURE and then again the MORNING OF SURGERY/PROCEDURE   1. If you chose to wash your hair, wash your hair first as usual with your normal shampoo.  2. After you shampoo, rinse your hair and body thoroughly to remove the shampoo.  3. Wash Face and genitals (private parts) with your normal soap.   4. THEN Shower with CHG Soap.   5. Use CHG as you would any other liquid soap. You can apply CHG directly to the skin and wash gently with a pouf/sponge or a clean washcloth.   6. Apply the CHG Soap to your body ONLY FROM THE NECK DOWN.  Do not use on open wounds or open sores. Avoid contact with your eyes, ears, mouth and genitals (private parts). Wash Face and genitals (private parts)  with your normal soap.   7. Wash thoroughly, paying special attention to the area where your surgery will be performed.  8. Thoroughly  rinse your body with warm water from the neck down.  9. DO NOT shower/wash with your normal soap after using and rinsing off the CHG Soap.  10. Pat yourself dry with a CLEAN TOWEL.  11. Wear CLEAN PAJAMAS to bed the night before surgery  12. Place CLEAN SHEETS on your bed the night before your surgery  13. DO NOT SLEEP WITH PETS.   Day of Surgery: Shower with CHG soap as directed.  Do not shave 48 hours prior to surgery.  Men may shave face and neck.  Do not wear lotions, powders, perfumes/colognes, or deodorant.  Wear Clean/Comfortable clothing the morning of surgery  Remember to brush your  teeth WITH YOUR REGULAR TOOTHPASTE.             Do not wear jewelry, make up, or nail polish  Do not bring valuables to the hospital.             [facility] is not responsible for any belongings or valuables.    Please read over the following fact sheets that you were given.

## 2021-02-23 ENCOUNTER — Ambulatory Visit (HOSPITAL_BASED_OUTPATIENT_CLINIC_OR_DEPARTMENT_OTHER)
Admission: RE | Admit: 2021-02-23 | Discharge: 2021-02-23 | Disposition: A | Discharge status: Home / Self Care | Payer: Medicare Other | Source: Ambulatory Visit | Attending: Thoracic Surgery (Cardiothoracic Vascular Surgery) | Admitting: Thoracic Surgery (Cardiothoracic Vascular Surgery)

## 2021-02-23 ENCOUNTER — Encounter (HOSPITAL_COMMUNITY)
Admission: RE | Admit: 2021-02-23 | Discharge: 2021-02-23 | Disposition: A | Discharge status: Home / Self Care | Payer: Medicare Other | Source: Ambulatory Visit | Attending: Thoracic Surgery (Cardiothoracic Vascular Surgery) | Admitting: Thoracic Surgery (Cardiothoracic Vascular Surgery)

## 2021-02-23 ENCOUNTER — Ambulatory Visit (INDEPENDENT_AMBULATORY_CARE_PROVIDER_SITE_OTHER): Payer: Medicare Other | Admitting: Thoracic Surgery (Cardiothoracic Vascular Surgery)

## 2021-02-23 ENCOUNTER — Other Ambulatory Visit (HOSPITAL_COMMUNITY)
Admission: RE | Admit: 2021-02-23 | Discharge: 2021-02-23 | Disposition: A | Discharge status: Home / Self Care | Payer: Medicare Other | Source: Ambulatory Visit | Attending: Thoracic Surgery (Cardiothoracic Vascular Surgery) | Admitting: Thoracic Surgery (Cardiothoracic Vascular Surgery)

## 2021-02-23 ENCOUNTER — Encounter: Payer: Self-pay | Admitting: Thoracic Surgery (Cardiothoracic Vascular Surgery)

## 2021-02-23 ENCOUNTER — Other Ambulatory Visit: Payer: Self-pay

## 2021-02-23 ENCOUNTER — Ambulatory Visit (HOSPITAL_COMMUNITY)
Admission: RE | Admit: 2021-02-23 | Discharge: 2021-02-23 | Disposition: A | Discharge status: Home / Self Care | Payer: Medicare Other | Source: Ambulatory Visit | Attending: Thoracic Surgery (Cardiothoracic Vascular Surgery) | Admitting: Thoracic Surgery (Cardiothoracic Vascular Surgery)

## 2021-02-23 ENCOUNTER — Encounter (HOSPITAL_COMMUNITY): Payer: Self-pay

## 2021-02-23 VITALS — BP 148/94 | HR 54 | Resp 20 | Ht 70.0 in | Wt 205.0 lb

## 2021-02-23 DIAGNOSIS — Z20822 Contact with and (suspected) exposure to covid-19: Secondary | ICD-10-CM | POA: Insufficient documentation

## 2021-02-23 DIAGNOSIS — Z01812 Encounter for preprocedural laboratory examination: Secondary | ICD-10-CM | POA: Insufficient documentation

## 2021-02-23 DIAGNOSIS — I340 Nonrheumatic mitral (valve) insufficiency: Secondary | ICD-10-CM | POA: Insufficient documentation

## 2021-02-23 DIAGNOSIS — I341 Nonrheumatic mitral (valve) prolapse: Secondary | ICD-10-CM

## 2021-02-23 DIAGNOSIS — Z01818 Encounter for other preprocedural examination: Secondary | ICD-10-CM

## 2021-02-23 HISTORY — DX: Nonrheumatic mitral (valve) prolapse: I34.1

## 2021-02-23 HISTORY — DX: Cardiac murmur, unspecified: R01.1

## 2021-02-23 HISTORY — DX: Nonrheumatic mitral (valve) insufficiency: I34.0

## 2021-02-23 LAB — COMPREHENSIVE METABOLIC PANEL
ALT: 29 U/L (ref 0–44)
AST: 37 U/L (ref 15–41)
Albumin: 3.5 g/dL (ref 3.5–5.0)
Alkaline Phosphatase: 51 U/L (ref 38–126)
Anion gap: 11 (ref 5–15)
BUN: 18 mg/dL (ref 8–23)
CO2: 20 mmol/L — ABNORMAL LOW (ref 22–32)
Calcium: 9.3 mg/dL (ref 8.9–10.3)
Chloride: 104 mmol/L (ref 98–111)
Creatinine, Ser: 0.94 mg/dL (ref 0.61–1.24)
GFR, Estimated: >60 mL/min (ref >60)
Glucose, Bld: 100 mg/dL — ABNORMAL HIGH (ref 70–99)
Potassium: 3.8 mmol/L (ref 3.5–5.1)
Sodium: 135 mmol/L (ref 135–145)
Total Bilirubin: 0.9 mg/dL (ref 0.3–1.2)
Total Protein: 6.4 g/dL — ABNORMAL LOW (ref 6.5–8.1)

## 2021-02-23 LAB — BLOOD GAS, ARTERIAL
Acid-base deficit: 0.3 mmol/L (ref 0.0–2.0)
Bicarbonate: 22.6 mmol/L (ref 20.0–28.0)
FIO2: 21
O2 Saturation: 98.9 %
Patient temperature: 37
pCO2 arterial: 29.1 mmHg — ABNORMAL LOW (ref 32.0–48.0)
pH, Arterial: 7.502 — ABNORMAL HIGH (ref 7.350–7.450)
pO2, Arterial: 124 mmHg — ABNORMAL HIGH (ref 83.0–108.0)

## 2021-02-23 LAB — CBC
HCT: 44.4 % (ref 39.0–52.0)
Hemoglobin: 15.2 g/dL (ref 13.0–17.0)
MCH: 30.3 pg (ref 26.0–34.0)
MCHC: 34.2 g/dL (ref 30.0–36.0)
MCV: 88.4 fL (ref 80.0–100.0)
Platelets: 199 10*3/uL (ref 150–400)
RBC: 5.02 MIL/uL (ref 4.22–5.81)
RDW: 13.1 % (ref 11.5–15.5)
WBC: 8.7 10*3/uL (ref 4.0–10.5)
nRBC: 0 % (ref 0.0–0.2)

## 2021-02-23 LAB — URINALYSIS, ROUTINE W REFLEX MICROSCOPIC
Bacteria, UA: NONE SEEN
Bilirubin Urine: NEGATIVE
Glucose, UA: NEGATIVE mg/dL
Ketones, ur: NEGATIVE mg/dL
Leukocytes,Ua: NEGATIVE
Nitrite: NEGATIVE
Protein, ur: NEGATIVE mg/dL
Specific Gravity, Urine: 1.01 (ref 1.005–1.030)
pH: 5 (ref 5.0–8.0)

## 2021-02-23 LAB — SARS CORONAVIRUS 2 (TAT 6-24 HRS): SARS Coronavirus 2: NEGATIVE

## 2021-02-23 LAB — SURGICAL PCR SCREEN
MRSA, PCR: NEGATIVE
Staphylococcus aureus: NEGATIVE

## 2021-02-23 LAB — APTT: aPTT: 28 seconds (ref 24–36)

## 2021-02-23 LAB — TYPE AND SCREEN
ABO/RH(D): AB POS
Antibody Screen: NEGATIVE

## 2021-02-23 LAB — PROTIME-INR
INR: 1 (ref 0.8–1.2)
Prothrombin Time: 12.4 seconds (ref 11.4–15.2)

## 2021-02-23 LAB — HEMOGLOBIN A1C
Hgb A1c MFr Bld: 5.8 % — ABNORMAL HIGH (ref 4.8–5.6)
Mean Plasma Glucose: 119.76 mg/dL

## 2021-02-23 NOTE — H&P (Signed)
[name]       [name] [address]             [phone]          CARDIOTHORACIC SURGERY HISTORY AND PHYSICAL EXAM  Referring Provider is Ricky Harp, MD PCP is Ricky Hatchet, MD      Chief Complaint  Patient presents with  . Mitral Regurgitation    TEE 11/17, Echo 10/8  . Consult    Initial surgical consult     HPI:  Patient is a 70 year old male with history of hypertension who has been referred for surgical consultation to discuss treatment options for management of recently discovered mitral valve prolapse with severe mitral regurgitation.  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  He was noted to have a prominent systolic murmur at the time of his most recent routine follow-up physical examination with his primary care physician.  Transthoracic echocardiogram was performed demonstrating the presence of normal left ventricular function with mitral valve prolapse and at least moderate to severe mitral regurgitation.  The patient was seen in consultation by Dr. Gwenlyn Fitzgerald and subsequently underwent transesophageal echocardiogram October 22, 2020 confirming the presence of mitral valve prolapse involving the P3 segment of the posterior leaflet with ruptured primary chordae tendinae causing severe mitral regurgitation.  Left ventricular function remains preserved.  The patient was referred for surgical consultation.  Patient is single and lives locally in [place].  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  He has remained physically active, exercises regularly, and has had no significant physical limitations throughout adulthood.  He enjoys cycling and skiing and he has previously always  exercised on a regular basis.  He states that this past summer he noticed a tendency to have slightly decreased physical endurance and exercise capacity when riding his bicycle.  He at times feels tachypalpitations that always seem to be induced by strenuous exertion.  He otherwise has remained essentially entirely asymptomatic.  He denies exertional shortness of breath, resting shortness of breath, PND, orthopnea, or lower extremity edema.  He has never had any significant chest pain or chest tightness.  He has essentially stopped exercising on a regular basis since he was Fitzgerald to have murmur on exam and severe mitral regurgitation.  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  He was initially seen in consultation on October 30, 2020 at which time we reviewed results of his recent echocardiograms and discussed long-term treatment options for management of severe primary mitral regurgitation.  The patient has made a decision to proceed with elective mitral valve repair and underwent diagnostic cardiac catheterization last month by Dr. Gwenlyn Fitzgerald which revealed mild nonobstructive coronary artery disease.  The patient returns her office today for follow-up with tentative plans to proceed with surgery later this week.  He reports no new problems or complaints.  He states that he did go skiing this winter and he noted a slight tendency to get short of breath more easily than a had in the past, although his symptoms of exertional shortness of breath did not limit him much at all.  He otherwise feels well and is looking forward to proceeding with elective mitral valve repair.  Past Medical History:  Diagnosis Date  . Heart murmur   . Hyperlipidemia   . Hypertension   . Mitral regurgitation   . Mitral valve prolapse   . Trauma     Past Surgical History:  Procedure  Laterality Date  . left wrist fx    . PROSTATE SURGERY     biopsy x2  . RIGHT/LEFT HEART CATH AND CORONARY ANGIOGRAPHY N/A 02/19/2021   Procedure: RIGHT/LEFT HEART CATH AND CORONARY ANGIOGRAPHY;  Surgeon: Ricky Harp, MD;  Location: [facility];  Service: Cardiovascular;  Laterality: N/A;  . rotator cuff sur  1990  . TEE WITHOUT CARDIOVERSION N/A 10/22/2020   Procedure: TRANSESOPHAGEAL ECHOCARDIOGRAM (TEE);  Surgeon: Ricky Klein, MD;  Location: [facility] ENDOS[facility];  Service: Cardiovascular;  Laterality: N/A;    Family History  Adopted: Yes    Social History Social History   Tobacco Use  . Smoking status: Never Smoker  . Smokeless tobacco: Never Used  Vaping Use  . Vaping Use: Never used  Substance Use Topics  . Alcohol use: Yes    Alcohol/week: 5.0 standard drinks    Types: 5 Cans of beer per week  . Drug use: No    Prior to Admission medications   Medication Sig Start Date End Date Taking? Authorizing Provider  augmented betamethasone dipropionate (DIPROLENE-AF) 0.05 % cream Apply 1 application topically daily as needed (irritation). 09/03/20  Yes [provider]  Cholecalciferol (VITAMIN D-3) 125 MCG (5000 UT) TABS Take 5,000 tablets by mouth in the morning and at bedtime.   Yes [provider]  Coenzyme Q10 100 MG TABS Take 100 mg by mouth daily.   Yes [provider]  desonide (DESOWEN) 0.05 % ointment Apply 1 application topically daily as needed (irritation). 09/03/20  Yes [provider]  dutasteride (AVODART) 0.5 MG capsule Take 0.5 mg by mouth daily.   Yes [provider]  multivitamin [facility]) per tablet Take 1 tablet by mouth daily.   Yes [provider]  Red Yeast Rice Extract (RED YEAST RICE PO) Take 1 tablet by mouth daily at 12 noon.   Yes [provider]  telmisartan-hydrochlorothiazide (MICARDIS HCT) 80-12.5 MG per tablet Take 1 tablet by mouth daily. 05/28/13  Yes Ricky Dillon, MD   aspirin 81 MG chewable tablet Chew 81 mg by mouth once. Pre cath    [provider]    No Known Allergies     Review of Systems:              General:                      normal appetite, normal energy, no weight gain, no weight loss, no fever             Cardiac:                       no chest pain with exertion, no chest pain at rest, no SOB with exertion, no resting SOB, no PND, no orthopnea, + exercise induced palpitations, no arrhythmia, no atrial fibrillation, no LE edema, no dizzy spells, no syncope             Respiratory:                 no shortness of breath, no home oxygen, no productive cough, no dry  cough, no bronchitis, no wheezing, no hemoptysis, no asthma, no pain with inspiration or cough, no sleep apnea, no CPAP at night             GI:                               no difficulty swallowing, no reflux, no frequent heartburn, no hiatal hernia, no abdominal pain, no constipation, no diarrhea, no hematochezia, no hematemesis, no melena             GU:                              no dysuria,  no frequency, no urinary tract infection, no hematuria, + enlarged prostate, no kidney stones, no kidney disease             Vascular:                     no pain suggestive of claudication, no pain in feet, no leg cramps, no varicose veins, no DVT, no non-healing foot ulcer             Neuro:                         no stroke, no TIA's, no seizures, no headaches, no temporary blindness one eye,  no slurred speech, no peripheral neuropathy, no chronic pain, no instability of gait, no memory/cognitive dysfunction             Musculoskeletal:         no arthritis, no joint swelling, no myalgias, no difficulty walking, normal mobility              Skin:                            no rash, no itching, no skin infections, no pressure sores or ulcerations             Psych:                         no anxiety, no depression, no nervousness, no unusual recent stress             Eyes:                            no blurry vision, no floaters, no recent vision changes, + wears glasses for reading only             ENT:                            no hearing loss, no loose or painful teeth, no dentures, last saw dentist within the past year             Hematologic:               no easy bruising, no abnormal bleeding, no clotting disorder, no frequent epistaxis             Endocrine:                   no diabetes, does not check CBG's at home  Physical Exam:              BP (!) 156/107 (BP Location: Left Arm, Patient Position: Sitting)   Pulse (!) 56   Resp 18   Ht 5\' 10"  (1.778 Fitzgerald)   Wt 198 lb (89.8 kg)   SpO2 96% Comment: RA with mask on  BMI 28.41 kg/Fitzgerald              General:                        well-appearing             HEENT:                       Unremarkable              Neck:                           no JVD, no bruits, no adenopathy              Chest:                          clear to auscultation, symmetrical breath sounds, no wheezes, no rhonchi              CV:                              RRR, grade III/VI blowing holosystolic murmur heard all across the precordium             Abdomen:                    soft, non-tender, no masses              Extremities:                 warm, well-perfused, pulses palpable, no LE edema             Rectal/GU                   Deferred             Neuro:                         Grossly non-focal and symmetrical throughout             Skin:                            Clean and dry, no rashes, no breakdown   Diagnostic Tests:   ECHOCARDIOGRAM REPORT       Patient Name:  Ricky Fitzgerald  Date of Exam: 08/29/2020  Medical Rec #: [id number]   Height:    64.0 in  Accession #:  [id number]  Weight:    204.0 lb  Date of Birth: Jun 05, 1951   BSA:     1.973 Fitzgerald  Patient Age:  63 years   BP:      122/80 mmHg  Patient Gender: Fitzgerald       HR:      56 bpm.  Exam  Location: [facility]   Procedure: 2D Echo, Cardiac Doppler and Color Doppler     Contacted Dr [employer] office  with results at 3:30pm on 08/29/20.   Indications:   R01.1 Murmur    History:     Patient has no prior history of Echocardiogram  examinations.          Risk Factors:Hypertension and Dyslipidemia.    Sonographer:   Cresenciano Lick RDCS  Referring Phys: [place]  Diagnosing Phys: Oswaldo Milian MD   IMPRESSIONS    1. Left ventricular ejection fraction, by estimation, is 65 to 70%. The  left ventricle has normal function. The left ventricle has no regional  wall motion abnormalities. There is mild left ventricular hypertrophy.  Left ventricular diastolic parameters  are consistent with Grade II diastolic dysfunction (pseudonormalization).  Elevated left atrial pressure.  2. Right ventricular systolic function is normal. The right ventricular  size is mildly enlarged. There is normal pulmonary artery systolic  pressure. The estimated right ventricular systolic pressure is 65.7 mmHg.  3. Left atrial size was mildly dilated.  4. The aortic valve is tricuspid. Aortic valve regurgitation is not  visualized. No aortic stenosis is present.  5. Aortic dilatation noted. There is dilatation of the ascending aorta,  measuring 41 mm.  6. The inferior vena cava is normal in size with greater than 50%  respiratory variability, suggesting right atrial pressure of 3 mmHg.  7. The mitral valve is abnormal. Posterior leaflet prolapse with  eccentric anterior directed jet. Moderate to severe mitral regurgitation.  Difficult to quantify regurgitation given eccentric jet but suspect severe  regurgitation. Recommend TEE for  further evaluation.   FINDINGS  Left Ventricle: Left ventricular ejection fraction, by estimation, is 65  to 70%. The left ventricle has normal function. The left ventricle has no  regional wall motion  abnormalities. The left ventricular internal cavity  size was normal in size. There is  mild left ventricular hypertrophy. Left ventricular diastolic parameters  are consistent with Grade II diastolic dysfunction (pseudonormalization).  Elevated left atrial pressure.   Right Ventricle: The right ventricular size is mildly enlarged. Right  vetricular wall thickness was not assessed. Right ventricular systolic  function is normal. There is normal pulmonary artery systolic pressure.  The tricuspid regurgitant velocity is  2.64 Fitzgerald/s, and with an assumed right atrial pressure of 3 mmHg, the  estimated right ventricular systolic pressure is 84.6 mmHg.   Left Atrium: Left atrial size was mildly dilated.   Right Atrium: Right atrial size was normal in size.   Pericardium: There is no evidence of pericardial effusion.   Mitral Valve: The mitral valve is abnormal. Moderate to severe mitral  valve regurgitation.   Tricuspid Valve: The tricuspid valve is normal in structure. Tricuspid  valve regurgitation is mild.   Aortic Valve: The aortic valve is tricuspid. Aortic valve regurgitation is  not visualized. No aortic stenosis is present. Aortic valve mean gradient  measures 5.3 mmHg. Aortic valve peak gradient measures 9.5 mmHg. Aortic  valve area, by VTI measures 2.18  cm.   Pulmonic Valve: The pulmonic valve was not well visualized. Pulmonic valve  regurgitation is not visualized.   Aorta: The aortic root is normal in size and structure and aortic  dilatation noted. There is dilatation of the ascending aorta, measuring 41  mm.   Venous: The inferior vena cava is normal in size with greater than 50%  respiratory variability, suggesting right atrial pressure of 3 mmHg.   IAS/Shunts: No atrial level shunt detected by color flow Doppler.     LEFT VENTRICLE  PLAX 2D  LVIDd:  5.00 cm Diastology  LVIDs:     3.00 cm LV e' medial:  5.98 cm/s  LV PW:     0.90 cm LV  E/e' medial: 21.4  LV IVS:    0.90 cm LV e' lateral:  9.57 cm/s  LVOT diam:   2.20 cm LV E/e' lateral: 13.4  LV SV:     70  LV SV Index:  35  LVOT Area:   3.80 cm     RIGHT VENTRICLE       IVC  RV Basal diam: 4.80 cm   IVC diam: 1.80 cm  RV S prime:   14.75 cm/s  TAPSE (Fitzgerald-mode): 2.3 cm   LEFT ATRIUM       Index    RIGHT ATRIUM      Index  LA diam:    5.80 cm 2.94 cm/Fitzgerald RA Area:   19.30 cm  LA Vol (A2C):  78.0 ml 39.54 ml/Fitzgerald RA Volume:  59.00 ml 29.91 ml/Fitzgerald  LA Vol (A4C):  77.9 ml 39.48 ml/Fitzgerald  LA Biplane Vol: 78.9 ml 39.99 ml/Fitzgerald  AORTIC VALVE  AV Area (Vmax):  2.28 cm  AV Area (Vmean):  2.26 cm  AV Area (VTI):   2.18 cm  AV Vmax:      154.00 cm/s  AV Vmean:     107.667 cm/s  AV VTI:      0.319 Fitzgerald  AV Peak Grad:   9.5 mmHg  AV Mean Grad:   5.3 mmHg  LVOT Vmax:     92.30 cm/s  LVOT Vmean:    64.100 cm/s  LVOT VTI:     0.183 Fitzgerald  LVOT/AV VTI ratio: 0.57    AORTA  Ao Root diam: 3.90 cm  Ao Asc diam: 4.10 cm   MITRAL VALVE        TRICUSPID VALVE  MV Area (PHT): 3.85 cm   TR Peak grad:  27.9 mmHg  MV Decel Time: 197 msec   TR Vmax:    264.00 cm/s  MR Peak grad: 117.3 mmHg  MR Mean grad: 88.5 mmHg   SHUNTS  MR Vmax:   541.50 cm/s  Systemic VTI: 0.18 Fitzgerald  MR Vmean:   455.5 cm/s  Systemic Diam: 2.20 cm  MV E velocity: 128.00 cm/s  MV A velocity: 96.70 cm/s  MV E/A ratio: 1.32   Oswaldo Milian MD  Electronically signed by Oswaldo Milian MD  Signature Date/Time: 08/29/2020/3:23:14 PM       TRANSESOPHOGEAL ECHO REPORT       Patient Name:  Ricky Fitzgerald Date of Exam: 10/22/2020  Medical Rec #: [id number]  Height:    70.0 in  Accession #:  [id number] Weight:    199.0 lb  Date of Birth: 1951/07/14  BSA:     2.083 Fitzgerald  Patient Age:  22 years  BP:      113/87 mmHg  Patient Gender: Fitzgerald      HR:      67  bpm.  Exam Location: Inpatient   Procedure: Transesophageal Echo, Color Doppler, Cardiac Doppler and 3D  Echo   Indications:  Mitral Regurgitation    History:    Patient has prior history of Echocardiogram examinations,  most         recent 08/29/2020. Risk Factors:Hypertension and  Dyslipidemia.    Sonographer:  Mikki Santee RDCS (AE)  Referring Phys: [phone] [facility]   PROCEDURE: The transesophogeal probe was passed without difficulty through  the esophogus of the patient. Sedation performed  by different physician.  The patient was monitored while under deep sedation. Anesthestetic  sedation was provided intravenously by  Anesthesiology: 289.78mg  of Propofol. The patient developed no  complications during the procedure.   IMPRESSIONS    1. Left ventricular ejection fraction, by estimation, is 60 to 65%. The  left ventricle has normal function.  2. Right ventricular systolic function is normal. The right ventricular  size is normal. There is normal pulmonary artery systolic pressure. The  estimated right ventricular systolic pressure is 16.1 mmHg.  3. Left atrial size was mildly dilated. No left atrial/left atrial  appendage thrombus was detected.  4. There is at least one ruptured chord to the medial scallop (P3) of the  posterior leaflet, with subsequent flail motion of the P3 scallop and a  highly eccentric regurgitant jet. Vena contracta 5 mm. Effective  regurgitant orifice area is 0.7 cm, the  regurgitant volume is 90 ml, the regurgitant fraction is 60% (By the PISA  method). Regurgitant volume is 81 ml and regurgitant fraction is 57% by  the continuity equation.. The mitral valve is myxomatous. Severe mitral  valve regurgitation. No evidence of  mitral stenosis. There is severe holosystolic prolapse of both leaflets of  the mitral valve.  5. The aortic valve is tricuspid. Aortic valve regurgitation is not  visualized. No aortic  stenosis is present.  6. There is mild dilatation of the ascending aorta, measuring 41 mm.   FINDINGS  Left Ventricle: Left ventricular ejection fraction, by estimation, is 60  to 65%. The left ventricle has normal function. The left ventricular  internal cavity size was normal in size. There is no left ventricular  hypertrophy.   Right Ventricle: The right ventricular size is normal. No increase in  right ventricular wall thickness. Right ventricular systolic function is  normal. There is normal pulmonary artery systolic pressure. The tricuspid  regurgitant velocity is 2.23 Fitzgerald/s, and  with an assumed right atrial pressure of 3 mmHg, the estimated right  ventricular systolic pressure is 09.6 mmHg.   Left Atrium: Left atrial size was mildly dilated. No left atrial/left  atrial appendage thrombus was detected.   Right Atrium: Right atrial size was normal in size.   Pericardium: There is no evidence of pericardial effusion.   Mitral Valve: There is at least one ruptured chord to the medial scallop  (P3) of the posterior leaflet, with subsequent flail motion of the P3  scallop and a highly eccentric regurgitant jet. Vena contracta 5 mm.  Effective regurgitant orifice area is 0.7  cm, the regurgitant volume is 90 ml, the regurgitant fraction is 60% (By  the PISA method). Regurgitant volume is 81 ml and regurgitant fraction is  57% by the continuity equation. The mitral valve is myxomatous. There is  severe holosystolic prolapse of  both leaflets of the mitral valve. Severe mitral valve regurgitation, with  eccentric anteriorly directed jet. No evidence of mitral valve stenosis.   Tricuspid Valve: The tricuspid valve is normal in structure. Tricuspid  valve regurgitation is mild.   Aortic Valve: The aortic valve is tricuspid. Aortic valve regurgitation is  not visualized. No aortic stenosis is present.   Pulmonic Valve: The pulmonic valve was grossly normal. Pulmonic valve   regurgitation is trivial.   Aorta: The aortic root is normal in size and structure. There is mild  dilatation of the ascending aorta, measuring 41 mm. There is minimal  (Grade I) atheroma plaque involving the descending aorta.   IAS/Shunts: No atrial level  shunt detected by color flow Doppler.     LEFT VENTRICLE  PLAX 2D  LVOT diam:   2.34 cm  LV SV:     57  LV SV Index:  27  LVOT Area:   4.29 cm     AORTIC VALVE  LVOT Vmax:  81.69 cm/s  LVOT Vmean: 46.655 cm/s  LVOT VTI:  0.132 Fitzgerald   MR Peak grad:  74.3 mmHg  TRICUSPID VALVE  MR Mean grad:  48.0 mmHg  TR Peak grad:  19.9 mmHg  MR Vmax:     431.00 cm/s TR Vmax:    223.00 cm/s  MR Vmean:    326.0 cm/s  MR PISA:     9.05 cm  SHUNTS  MR PISA Eff ROA: 72 mm   Systemic VTI: 0.13 Fitzgerald  MR PISA Radius: 1.20 cm   Systemic Diam: 2.34 cm   Mihai Croitoru MD  Electronically signed by Ricky Klein MD  Signature Date/Time: 10/22/2020/6:24:31 PM        CT ANGIOGRAPHY CHEST, ABDOMEN AND PELVIS  TECHNIQUE: Non-contrast CT of the chest was initially obtained.  Multidetector CT imaging through the chest, abdomen and pelvis was performed using the standard protocol during bolus administration of intravenous contrast. Multiplanar reconstructed images and MIPs were obtained and reviewed to evaluate the vascular anatomy.  CONTRAST: 69mL ISOVUE-370 IOPAMIDOL (ISOVUE-370) INJECTION 76%  COMPARISON: June 03, 2010.  FINDINGS: CTA CHEST FINDINGS  Cardiovascular: Preferential opacification of the thoracic aorta. No evidence of thoracic aortic dissection. 4.1 cm ascending thoracic aortic aneurysm is noted. Great vessels are widely patent without stenosis. Normal heart size. No pericardial effusion. Mild coronary artery calcifications are noted.  Mediastinum/Nodes: No enlarged mediastinal, hilar, or axillary lymph nodes. Thyroid gland, trachea, and esophagus demonstrate  no significant findings.  Lungs/Pleura: Lungs are clear. No pleural effusion or pneumothorax.  Musculoskeletal: No chest wall abnormality. No acute or significant osseous findings.  Review of the MIP images confirms the above findings.  CTA ABDOMEN AND PELVIS FINDINGS  VASCULAR  Aorta: Atherosclerosis of abdominal aorta is noted without aneurysm or dissection.  Celiac: Patent without evidence of aneurysm, dissection, vasculitis or significant stenosis.  SMA: Patent without evidence of aneurysm, dissection, vasculitis or significant stenosis.  Renals: Both renal arteries are patent without evidence of aneurysm, dissection, vasculitis, fibromuscular dysplasia or significant stenosis.  IMA: Patent without evidence of aneurysm, dissection, vasculitis or significant stenosis.  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 2.3 cm fusiform aneurysm is seen involving the right common iliac artery. No significant stenosis is noted throughout the iliac arteries.  Veins: No obvious venous abnormality within the limitations of this arterial phase study.  Review of the MIP images confirms the above findings.  NON-VASCULAR  Hepatobiliary: No gallstones or biliary dilatation is noted. Left hepatic cyst is noted. At least 2 peripherally enhancing low densities are noted in the liver, the largest measuring 5.6 cm centrally. These most likely represent benign hemangiomas, but MRI is recommended for confirmation.  Pancreas: Unremarkable. No pancreatic ductal dilatation or surrounding inflammatory changes.  Spleen: Normal in size without focal abnormality.  Adrenals/Urinary Tract: Adrenal glands appear normal. Left renal cyst is noted. No hydronephrosis or renal obstruction is noted. No renal or ureteral calculi are noted. Urinary bladder is unremarkable.  Stomach/Bowel: Stomach is within normal limits. Appendix  appears normal. No evidence of bowel wall thickening, distention, or inflammatory changes.  Lymphatic: No significant adenopathy is noted.  Reproductive: Severe prostatic enlargement is  noted which is unchanged compared to prior exam.  Other: Small fat containing right inguinal hernia is noted. No ascites is noted.  Musculoskeletal: No acute or significant osseous findings.  Review of the MIP images confirms the above findings.  IMPRESSION: 1. No evidence of thoracic or abdominal aortic dissection. 2. 4.1 cm ascending thoracic aortic aneurysm is noted. Recommend annual imaging followup by CTA or MRA. This recommendation follows 2010 ACCF/AHA/AATS/ACR/ASA/SCA/SCAI/SIR/STS/SVM Guidelines for the Diagnosis and Management of Patients with Thoracic Aortic Disease. Circulation. 2010; 121: L798-X211. Aortic aneurysm NOS (ICD10-I71.9). 3. Mild coronary artery calcifications are noted suggesting coronary artery disease. 4. 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 2.3 cm fusiform aneurysm is seen involving the right common iliac artery. 5. At least 2 peripherally enhancing low densities are noted in the liver, the largest measuring 5.6 cm centrally. These most likely represent benign hemangiomas, but MRI is recommended for confirmation and to rule out other pathology. 6. Severe prostatic enlargement is noted which is unchanged compared to prior exam. 7. Small fat containing right inguinal hernia is noted. 8. Aortic atherosclerosis.  Aortic Atherosclerosis (ICD10-I70.0).   Electronically Signed By: Marijo Conception Fitzgerald.D. On: 11/26/2020 09:48    MRI ABDOMEN WITHOUT AND WITH CONTRAST  TECHNIQUE: Multiplanar multisequence MR imaging of the abdomen was performed both before and after the administration of intravenous contrast.  CONTRAST: 53mL MULTIHANCE GADOBENATE DIMEGLUMINE 529 MG/ML IV SOLN  COMPARISON: Multiple exams,  including 11/26/2020  FINDINGS: Despite efforts by the technologist and patient, motion artifact is present on today's exam and could not be eliminated. This reduces exam sensitivity and specificity.  Lower chest: Unremarkable  Hepatobiliary: 3.2 by 2.4 cm cyst in the left hepatic lobe on image 34 series 10, benign.  5.3 by 3.6 cm hemangioma posteriorly in the lateral segment left hepatic lobe with accentuated homogeneous T2 signal and peripheral nodular centripetal enhancement characteristic of hemangioma. This lesion was present but substantially smaller on the CT abdomen from 06/03/2010.  Subcapsular T2 hyperintense 1.6 by 0.9 cm lesion posteriorly along the right hepatic lobe on image 15 of series 3 demonstrating peripheral nodular centripetal enhancement and diffuse delayed enhancement compatible with may angioma.  T2 hyperintense 1.3 by 0.7 cm lesion in the right hepatic lobe on image 13 of series 3 demonstrates delayed enhancement compatible with a small hemangioma.  0.4 cm T2 signal hyperintensity in the dome of the right hepatic lobe on image 9 of series 3, this demonstrates diffuse enhancement and delayed enhancement characteristic of hemangioma.  Nonspecific 0.8 by 0.3 cm lesion inferiorly in the right hepatic lobe on image 32 of series 5, too small to characterize although statistically likely to be benign.  These lesions account for all of the lesions visible on the prior CT.  The gallbladder appears unremarkable. No biliary dilatation.  Pancreas: Unremarkable  Spleen: Unremarkable  Adrenals/Urinary Tract: 1.2 cm simple appearing cyst of the left kidney upper pole. Adrenal glands unremarkable.  Stomach/Bowel: Unremarkable  Vascular/Lymphatic: Aortoiliac atherosclerotic vascular disease. No pathologic upper abdominal adenopathy is identified.  Other: No supplemental non-categorized findings.  Musculoskeletal: Lumbar spondylosis  and degenerative disc disease.  IMPRESSION: 1. Multiple hepatic hemangiomas, largest measuring 5.3 by 3.6 cm in the lateral segment left hepatic lobe. Several additional smaller lesions are too small to characterize although statistically likely to be benign. No worrisome hepatic lesions are identified. No specific explanation for the patient's elevated CA19-9 level. 2. Lumbar spondylosis and degenerative disc disease. 3. 1.2 cm simple appearing cyst  of the left kidney upper pole. 4. Despite efforts by the technologist and patient, motion artifact is present on today's exam and could not be eliminated. This reduces exam sensitivity and specificity.   Electronically Signed By: Van Clines Fitzgerald.D. On: 12/23/2020 14:18    RIGHT/LEFT HEART CATH AND CORONARY ANGIOGRAPHY    Conclusion    Mid LAD-1 lesion is 40% stenosed.  Mid LAD-2 lesion is 40% stenosed.  Prox RCA lesion is 40% stenosed.  Hemodynamic findings consistent with mitral valve regurgitation.  Ricky Fitzgerald a 70 y.o.male   [id number] LOCATION: FACILITY: [facility]  PHYSICIAN: Quay Burow, Fitzgerald.D. [birth year]   DATE OF PROCEDURE: 02/19/2021  DATE OF DISCHARGE:     CARDIAC CATHETERIZATION    History obtained from chart review.Ricky Fitzgerald a 5 [email or web address] overweight divorced Caucasian male father of 2, grandfather of 2 grandchildren who works with an urgent care physician for [employer] and was referred by his PCP, Dr. Ardeth Perfect, for evaluation of severe mitral vegetation. His only risk factor for cardiovascular disease is treated hypertension. There is no family history for heart disease. Never had an attack or stroke. He denies chest pain or shortness of breath. He is very active and rides bikes and skis in the winter. He had a murmur auscultated by his PCP which led to a 2D echo performed 08/29/2020 that showed normal LV systolic function, severe MR with posterior leaflet  prolapse and mild left atrial enlargement. 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 He he has noticed progressive decreased exercise tolerance. He is very active. He had a TEE by Dr. Sallyanne Kuster that showed severe MR with P3 that was flail. He presents now for right left heart cath to define his anatomy and physiology in anticipation of a minimally invasive mitral valve repair by Dr. Roxy Manns.   IMPRESSION:Mr. Mcomber has diffuse mild CAD but nothing that was obstructive. His LVEDP was mildly elevated and his V wave was 27. He is a good candidate for minimally invasive mitral valve repair. He will not need coronary revascularization. The radial sheath was removed and a TR band was placed on the right wrist to achieve patent hemostasis. The patient left lab in stable condition. He will be discharged home later today as an outpatient and will see me back in the office in 1 week for follow-up at which time I will refer him back to Dr. Roxy Manns to complete his work-up.  Quay Burow. MD, [facility] 02/19/2021 2:14 PM      Recommendations  Antiplatelet/Anticoag Recommend Aspirin 81mg  daily for moderate CAD.   Indications  Severe mitral regurgitation [I34.0 (ICD-10-CM)]   Procedural Details  Technical Details PROCEDURE DESCRIPTION:   The patient was brought to the second floor [facility] cath lab in the postabsorptive state. He was not premedicated . His right wrist and antecubital fossaWere prepped and shaved in usual sterile fashion. Xylocaine 1% was used for local anesthesia. A 6 French sheath was inserted into the right radial artery using standard Seldinger technique. A 7 French sheath was inserted into the right antecubital vein. A 7 [place] balloontipped thermal lesion Swan-Ganz catheter was then advanced through the right heart chambers obtaining sequential pressures and pulmonary artery blood samples further determination of Fick cardiac output. 5 [place] TIG  catheter, right Judkins catheter and pigtail catheters were used for selective coronary angiography and obtain left heart pressures. The patient received 4500 units of heparin intravenously. Isovue dye was used for the entirety of the case. Retrograde aortic  and left ventricular and pullback pressures were recorded. Radial cocktail was administered via the SideArm sheath. Estimated blood loss <50 mL.   During this procedure no sedation was administered.   Medications (Filter: Administrations occurring from 1315 to 1413 on 02/19/21)  lidocaine (PF) (XYLOCAINE) 1 % injection (mL) Total volume:  4 mL  Date/Time Rate/Dose/Volume Action   02/19/21 1332 4 mL Given    Radial Cocktail (Verapamil 5 mg, NTG, Lidocaine) (mL) Total volume:  5 mL  Date/Time Rate/Dose/Volume Action   02/19/21 1337 5 mL Canceled Entry   1344 5 mL Given    Heparin (Porcine) in NaCl 1000-0.9 UT/500ML-% SOLN (mL) Total volume:  1,000 mL  Date/Time Rate/Dose/Volume Action   02/19/21 1337 500 mL Given   1337 500 mL Given    heparin sodium (porcine) injection (Units) Total dose:  4,500 Units  Date/Time Rate/Dose/Volume Action   02/19/21 1346 4,500 Units Given    iohexol (OMNIPAQUE) 350 MG/ML injection (mL) Total volume:  60 mL  Date/Time Rate/Dose/Volume Action   02/19/21 1404 60 mL Given     Contrast  Medication Name Total Dose  iohexol (OMNIPAQUE) 350 MG/ML injection 60 mL    Radiation/Fluoro  Fluoro time: 9.2 (min) DAP: 84696 (mGycm2) Cumulative Air Kerma: 957 (mGy)   Coronary Findings   Diagnostic Dominance: Left  Left Anterior Descending  Mid LAD-1 lesion is 40% stenosed.  Mid LAD-2 lesion is 40% stenosed.  Right Coronary Artery  Prox RCA lesion is 40% stenosed.   Intervention   No interventions have been documented.  Right Heart  Right Heart Pressures Hemodynamic findings consistent with mitral valve regurgitation. Right atrial  pressure-9/7 Right ventricular pressure-33/3 Pulmonary artery pressure-27/13, mean 19 Pulmonary artery wedge pressure-A-wave 23, V wave 27, mean 20 LVEDP-23 Cardiac output-4.6 L/min with an index of 2.2 L/min/Fitzgerald by Fick   Coronary Diagrams   Diagnostic Dominance: Left    Intervention    Implants    No implant documentation for this case.    Syngo Images  Show images for CARDIAC CATHETERIZATION  Images on Long Term Storage  Show images for Ricky Fitzgerald, Ricky Fitzgerald  Link to Procedure Log  Procedure Log     Hemo Data  Flowsheet Row Most Recent Value  Fick Cardiac Output 4.62 L/min  Fick Cardiac Output Index 2.21 (L/min)/BSA  RA A Wave 9 mmHg  RA V Wave 7 mmHg  RA Mean 6 mmHg  RV Systolic Pressure 33 mmHg  RV Diastolic Pressure 3 mmHg  RV EDP 7 mmHg  PA Systolic Pressure 27 mmHg  PA Diastolic Pressure 13 mmHg  PA Mean 19 mmHg  PW A Wave 23 mmHg  PW V Wave 27 mmHg  PW Mean 20 mmHg  AO Systolic Pressure 295 mmHg  AO Diastolic Pressure 284 mmHg  AO Mean 132 mmHg  LV Systolic Pressure 440 mmHg  LV Diastolic Pressure 10 mmHg  LV EDP 23 mmHg  AOp Systolic Pressure 102 mmHg  AOp Diastolic Pressure 99 mmHg  AOp Mean Pressure 725 mmHg  LVp Systolic Pressure 366 mmHg  LVp Diastolic Pressure 11 mmHg  LVp EDP Pressure 23 mmHg  QP/QS 1  TPVR Index 8.6 HRUI  TSVR Index 56.55 HRUI  TPVR/TSVR Ratio 0.15       Impression:  Patient has mitral valve prolapse with at least stage C possibly bordering 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 He reports no significant exertional shortness of breath or chest discomfort although he has noticed what he feels is a significant  decline in his exercise capacity and endurance. He has also had reproducible episodes of tachypalpitations that are always initiated by strenuous physical exertion.  I have personally reviewed the patient's recent transthoracic and transesophageal echocardiograms,  diagnostic cardiac catheterization, and CT angiogram. 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 Although quantification of the severity of mitral regurgitation was challenging due to the eccentric nature of the jet, estimates of regurgitant volume ranged between 81 and 90 mL with PISAradius measured 1.20 cm.Left ventricular systolic function appears normal. The aortic valve appears normal. Right ventricular size and function appears normal. The tricuspid annulus does not appear dilated and there was only mild tricuspid regurgitation.  Catheterization reveals nonobstructive coronary artery disease and CT angiogram revealed no contraindication to peripheral cannulation for surgery.  Options include elective mitral valve repair versus continued observation with close medical follow-up. Based upon review of the patient's recent TEE I feel that the patient's valve should be repairable with greater than 98% confidence with anticipated risk of mortality well below 1%. The patient would need to undergo diagnostic cardiac catheterization prior to any type of surgical intervention to rule out the presence of coronary artery disease.   The patient appears to be a good candidate for minimally invasive approach for surgery.   Plan:  The patient was again counseled at length regardinghisdiagnosis of severe primary mitral regurgitation. We reviewed the results of their diagnostic tests including images from the most recent echocardiogram. We discussed the natural history of mitral regurgitation as well as alternative treatment strategies. We discussed the impact of hisage, current state of health, and lack ofsignificant comorbid medical problems on clinical  decision making. We went on to discuss the indications, risks and potential benefits of mitral valve repair as well as the timing of surgical intervention. The rationale for elective surgery has been explained, including a comparison between surgery and continued medical therapy with close follow-up. The likelihood of successful and durable mitral valve repair has been discussed with particular reference to the findings of the most recent echocardiogram. Based upon these findings and previous experience, I have quoted a greater than 98percent likelihood of successful valve repair with less than 1percent risk of mortality or major morbidity. Alternative surgical approaches have been discussed including a comparison between conventional sternotomy and minimally-invasive techniques. The relative risks and benefits of each have been reviewed as they pertain to the patient's specific circumstances, and expectations for the patient's postoperative convalescence has been discussed. We also discussed the fact that although anticipated risks of surgery would be relativelylowthere remain significantrisks including but not limited to risk of death, stroke or other neurologic complication, myocardial infarction, congestive heart failure, respiratory failure, renal failure, bleeding requiring transfusion and/or reexploration, arrhythmia, heart block or bradycardia requiring permanent pacemaker insertion, infection or other wound complications, pneumonia, pleural and/or pericardial effusion, pulmonary embolus, aortic dissection or other major vascular complication, or other immediate or delayed complications related to valve repair or replacement including but not limited to recurrent or persistent mitral regurgitation and/or mitral stenosis, LV outflow tract obstruction, aortic insufficiency, paravalvular leak, posterior AV groove disruption, structural valve deterioration and failure, thrombosis, embolization, or  endocarditis. Specific risks potentially related to the minimally-invasive approach were discussed at length, including but not limited to risk of conversion to full or partial sternotomy, aortic dissection  or other major vascular complication, unilateral acute lung injury or pulmonary edema, phrenic nerve dysfunction or paralysis, rib fracture, chronic pain, lung hernia, or lymphocele. All of hisquestions have been answered.  We plan to proceed with surgery on Wednesday, February 25, 2021.        Valentina Gu. Roxy Manns, MD 02/23/2021 12:56 PM

## 2021-02-23 NOTE — Progress Notes (Signed)
Pre-CABG vascular testing completed.  Please see CV Proc for preliminary results.   Vonzell Schlatter, RVT

## 2021-02-23 NOTE — Patient Instructions (Signed)
   Continue taking all current medications without change through the day before surgery.  Make sure to bring all of your medications with you when you come for your Pre-Admission Testing appointment at [facility] Short-Stay Department.  Have nothing to eat or drink after midnight the night before surgery.  On the morning of surgery do not take any medications.  At your appointment for Pre-Admission Testing at the [facility] Short-Stay Department you will be asked to sign permission forms for your upcoming surgery.  By definition your signature on these forms implies that you and/or your designee provide full informed consent for your planned surgical procedure(s), that alternative treatment options have been discussed, that you understand and accept any and all potential risks, and that you have some understanding of what to expect for your post-operative convalescence.  For elective mitral valve repair or replacement potential operative risks include but are not limited to at least some risk of death, stroke or other neurologic complication, myocardial infarction, congestive heart failure, respiratory failure, renal failure, bleeding requiring transfusion and/or reexploration, arrhythmia, heart block or bradycardia requiring permanent pacemaker insertion, infection or other wound complications, pneumonia, pleural and/or pericardial effusion, pulmonary embolus, aortic dissection or other major vascular complication, or other immediate or delayed complications related to valve repair or replacement including but not limited to recurrent or persistent mitral regurgitation and/or mitral stenosis, LV outflow tract obstruction, aortic insufficiency, paravalvular leak, posterior AV groove disruption, late structural valve deterioration and failure, thrombosis, embolization, or endocarditis.  Specific risks potentially related to the minimally-invasive approach include but  are not limited to risk of conversion to full or partial sternotomy, aortic dissection or other major vascular complication, unilateral acute lung injury or pulmonary edema, phrenic nerve dysfunction or paralysis, rib fracture, chronic pain, lung hernia, or lymphocele.  Please call to schedule a follow-up appointment in our office prior to surgery if you have any unresolved questions about your planned surgical procedure, the associated risks, alternative treatment options, and/or expectations for your post-operative recovery.    

## 2021-02-23 NOTE — Progress Notes (Signed)
PCP - Dr. Ardeth Perfect Cardiologist - Dr. Gwenlyn Found  PPM/ICD - n/a Device Orders -  Rep Notified -   Chest x-ray - 02/23/21 EKG - 02/13/21 Stress Test - patient states he had one in the early 90's; no follow up, symptoms were stress related, not cardiac ECHO - 10/22/20 Cardiac Cath - 02/19/21   Sleep Study - n/a CPAP -   Fasting Blood Sugar -  n/a Checks Blood Sugar _____ times a day  Blood Thinner Instructions: Aspirin Instructions:  ERAS Protcol - n/a PRE-SURGERY Ensure or G2-   COVID TEST- 02/23/21   Anesthesia review: yes, cardiac history/testing  Patient denies shortness of breath, fever, cough and chest pain at PAT appointment   All instructions explained to the patient, with a verbal understanding of the material. Patient agrees to go over the instructions while at home for a better understanding. Patient also instructed to self quarantine after being tested for COVID-19. The opportunity to ask questions was provided.

## 2021-02-23 NOTE — Progress Notes (Signed)
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  He was initially seen in consultation on October 30, 2020 at which time we reviewed results of his recent echocardiograms and discussed long-term treatment options for management of severe primary mitral regurgitation.  The patient has made a decision to proceed with elective mitral valve repair and underwent diagnostic cardiac catheterization last month by Dr. Gwenlyn Fitzgerald which revealed mild nonobstructive coronary artery disease.  The patient returns her office today for follow-up with tentative plans to proceed with surgery later this week.  He reports no new problems or complaints.  He states that he did go skiing this winter and he noted a slight tendency to get short of breath more easily than a had in the past, although his symptoms of exertional shortness of breath did not limit him much at all.  He otherwise feels well and is looking forward to proceeding with elective mitral valve repair.   Current Outpatient Medications  Medication Sig Dispense Refill  . aspirin 81 MG chewable tablet Chew 81 mg by mouth once. Pre cath    . augmented betamethasone dipropionate (DIPROLENE-AF) 0.05 % cream Apply 1 application topically daily as needed (irritation).    . Cholecalciferol (VITAMIN D-3) 125 MCG (5000 UT) TABS Take 5,000 tablets by mouth in the morning and at bedtime.    . Coenzyme Q10 100 MG TABS Take 100 mg by mouth daily.    Marland Kitchen desonide (DESOWEN) 0.05 % ointment Apply 1 application topically daily as needed (irritation).    Marland Kitchen dutasteride (AVODART) 0.5 MG capsule Take 0.5 mg by mouth daily.    .  multivitamin (THERAGRAN) per tablet Take 1 tablet by mouth daily.    . Red Yeast Rice Extract (RED YEAST RICE PO) Take 1 tablet by mouth daily at 12 noon.    Marland Kitchen telmisartan-hydrochlorothiazide (MICARDIS HCT) 80-12.5 MG per tablet Take 1 tablet by mouth daily. 30 tablet 11   Current Facility-Administered Medications  Medication Dose Route Frequency Provider Last Rate Last Admin  . sodium chloride flush (NS) 0.9 % injection 3 mL  3 mL Intravenous Q12H Ricky Pelton, NP          Physical Exam:   Ht 5\' 10"  (1.778 m)   BMI 29.32 kg/m   General:  Well-appearing  Chest:   Clear to auscultation  CV:   Regular rate and rhythm with grade 3/6 blowing holosystolic murmur  Incisions:  n/a  Abdomen:  Soft nontender  Extremities:  Warm and well-perfused  Diagnostic Tests:  CT ANGIOGRAPHY CHEST, ABDOMEN AND PELVIS  TECHNIQUE: Non-contrast CT of the chest was initially obtained.  Multidetector CT imaging through the chest, abdomen and pelvis was performed using the standard protocol during bolus administration of intravenous contrast. Multiplanar reconstructed images and MIPs were obtained and reviewed to evaluate the vascular anatomy.  CONTRAST:  61mL ISOVUE-370 IOPAMIDOL (ISOVUE-370) INJECTION 76%  COMPARISON:  June 03, 2010.  FINDINGS: CTA CHEST FINDINGS  Cardiovascular: Preferential opacification of the thoracic aorta. No evidence of thoracic aortic dissection. 4.1 cm ascending thoracic aortic aneurysm is noted.  Great vessels are widely patent without stenosis. Normal heart size. No pericardial effusion. Mild coronary artery calcifications are noted.  Mediastinum/Nodes: No enlarged mediastinal, hilar, or axillary lymph nodes. Thyroid gland, trachea, and esophagus demonstrate no significant findings.  Lungs/Pleura: Lungs are clear. No pleural effusion or pneumothorax.  Musculoskeletal: No chest wall abnormality. No acute or significant osseous findings.  Review  of the MIP images confirms the above findings.  CTA ABDOMEN AND PELVIS FINDINGS  VASCULAR  Aorta: Atherosclerosis of abdominal aorta is noted without aneurysm or dissection.  Celiac: Patent without evidence of aneurysm, dissection, vasculitis or significant stenosis.  SMA: Patent without evidence of aneurysm, dissection, vasculitis or significant stenosis.  Renals: Both renal arteries are patent without evidence of aneurysm, dissection, vasculitis, fibromuscular dysplasia or significant stenosis.  IMA: Patent without evidence of aneurysm, dissection, vasculitis or significant stenosis.  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 2.3 cm fusiform aneurysm is seen involving the right common iliac artery. No significant stenosis is noted throughout the iliac arteries.  Veins: No obvious venous abnormality within the limitations of this arterial phase study.  Review of the MIP images confirms the above findings.  NON-VASCULAR  Hepatobiliary: No gallstones or biliary dilatation is noted. Left hepatic cyst is noted. At least 2 peripherally enhancing low densities are noted in the liver, the largest measuring 5.6 cm centrally. These most likely represent benign hemangiomas, but MRI is recommended for confirmation.  Pancreas: Unremarkable. No pancreatic ductal dilatation or surrounding inflammatory changes.  Spleen: Normal in size without focal abnormality.  Adrenals/Urinary Tract: Adrenal glands appear normal. Left renal cyst is noted. No hydronephrosis or renal obstruction is noted. No renal or ureteral calculi are noted. Urinary bladder is unremarkable.  Stomach/Bowel: Stomach is within normal limits. Appendix appears normal. No evidence of bowel wall thickening, distention, or inflammatory changes.  Lymphatic: No significant adenopathy is noted.  Reproductive: Severe prostatic enlargement is noted which  is unchanged compared to prior exam.  Other: Small fat containing right inguinal hernia is noted. No ascites is noted.  Musculoskeletal: No acute or significant osseous findings.  Review of the MIP images confirms the above findings.  IMPRESSION: 1. No evidence of thoracic or abdominal aortic dissection. 2. 4.1 cm ascending thoracic aortic aneurysm is noted. Recommend annual imaging followup by CTA or MRA. This recommendation follows 2010 ACCF/AHA/AATS/ACR/ASA/SCA/SCAI/SIR/STS/SVM Guidelines for the Diagnosis and Management of Patients with Thoracic Aortic Disease. Circulation. 2010; 121: U202-R427. Aortic aneurysm NOS (ICD10-I71.9). 3. Mild coronary artery calcifications are noted suggesting coronary artery disease. 4. 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 2.3 cm fusiform aneurysm is seen involving the right common iliac artery. 5. At least 2 peripherally enhancing low densities are noted in the liver, the largest measuring 5.6 cm centrally. These most likely represent benign hemangiomas, but MRI is recommended for confirmation and to rule out other pathology. 6. Severe prostatic enlargement is noted which is unchanged compared to prior exam. 7. Small fat containing right inguinal hernia is noted. 8. Aortic atherosclerosis.  Aortic Atherosclerosis (ICD10-I70.0).   Electronically Signed   By: Marijo Conception M.D.   On: 11/26/2020 09:48    MRI ABDOMEN WITHOUT AND WITH CONTRAST  TECHNIQUE: Multiplanar multisequence MR imaging of the abdomen was performed both before and after the administration of intravenous contrast.  CONTRAST:  39mL MULTIHANCE GADOBENATE DIMEGLUMINE 529 MG/ML IV SOLN  COMPARISON:  Multiple exams, including 11/26/2020  FINDINGS: Despite efforts by the technologist and  patient, motion artifact is present on today's exam and could not be eliminated. This reduces exam sensitivity and  specificity.  Lower chest: Unremarkable  Hepatobiliary: 3.2 by 2.4 cm cyst in the left hepatic lobe on image 34 series 10, benign.  5.3 by 3.6 cm hemangioma posteriorly in the lateral segment left hepatic lobe with accentuated homogeneous T2 signal and peripheral nodular centripetal enhancement characteristic of hemangioma. This lesion was present but substantially smaller on the CT abdomen from 06/03/2010.  Subcapsular T2 hyperintense 1.6 by 0.9 cm lesion posteriorly along the right hepatic lobe on image 15 of series 3 demonstrating peripheral nodular centripetal enhancement and diffuse delayed enhancement compatible with may angioma.  T2 hyperintense 1.3 by 0.7 cm lesion in the right hepatic lobe on image 13 of series 3 demonstrates delayed enhancement compatible with a small hemangioma.  0.4 cm T2 signal hyperintensity in the dome of the right hepatic lobe on image 9 of series 3, this demonstrates diffuse enhancement and delayed enhancement characteristic of hemangioma.  Nonspecific 0.8 by 0.3 cm lesion inferiorly in the right hepatic lobe on image 32 of series 5, too small to characterize although statistically likely to be benign.  These lesions account for all of the lesions visible on the prior CT.  The gallbladder appears unremarkable.  No biliary dilatation.  Pancreas:  Unremarkable  Spleen:  Unremarkable  Adrenals/Urinary Tract: 1.2 cm simple appearing cyst of the left kidney upper pole. Adrenal glands unremarkable.  Stomach/Bowel: Unremarkable  Vascular/Lymphatic: Aortoiliac atherosclerotic vascular disease. No pathologic upper abdominal adenopathy is identified.  Other:  No supplemental non-categorized findings.  Musculoskeletal: Lumbar spondylosis and degenerative disc disease.  IMPRESSION: 1. Multiple hepatic hemangiomas, largest measuring 5.3 by 3.6 cm in the lateral segment left hepatic lobe. Several additional smaller lesions are  too small to characterize although statistically likely to be benign. No worrisome hepatic lesions are identified. No specific explanation for the patient's elevated CA19-9 level. 2. Lumbar spondylosis and degenerative disc disease. 3. 1.2 cm simple appearing cyst of the left kidney upper pole. 4. Despite efforts by the technologist and patient, motion artifact is present on today's exam and could not be eliminated. This reduces exam sensitivity and specificity.   Electronically Signed   By: Van Clines M.D.   On: 12/23/2020 14:18     RIGHT/LEFT HEART CATH AND CORONARY ANGIOGRAPHY    Conclusion    Mid LAD-1 lesion is 40% stenosed.  Mid LAD-2 lesion is 40% stenosed.  Prox RCA lesion is 40% stenosed.  Hemodynamic findings consistent with mitral valve regurgitation.   Ricky Fitzgerald is a 70 y.o. male    [id number] LOCATION:  FACILITY: [facility]  PHYSICIAN: Quay Burow, M.D. [birth year]   DATE OF PROCEDURE:  02/19/2021  DATE OF DISCHARGE:     CARDIAC CATHETERIZATION     History obtained from chart review.Ricky Fitzgerald a 74 [email or web address] overweight divorced Caucasian male father of 2, grandfather of 2 grandchildren who works with an urgent care physician for [employer] and was referred by his PCP, Dr. Ardeth Perfect, for evaluation of severe mitral vegetation. His only risk factor for cardiovascular disease is treated hypertension. There is no family history for heart disease. Never had an attack or stroke. He denies chest pain or shortness of breath. He is very active and rides bikes and skis in the winter. He had a murmur auscultated by his PCP which led to a 2D echo performed 08/29/2020 that showed normal LV systolic function, severe MR with posterior leaflet prolapse and mild  left atrial enlargement. 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 He he has noticed progressive decreased exercise tolerance.  He is very active.  He had a TEE by Dr.  Sallyanne Kuster that showed severe MR with P3 that was flail.  He presents now for right left heart cath to define his anatomy and physiology in anticipation of a minimally invasive mitral valve repair by Dr. Roxy Manns.   IMPRESSION: Ricky Fitzgerald has diffuse mild CAD but nothing that was obstructive.  His LVEDP was mildly elevated and his V wave was 27.  He is a good candidate for minimally invasive mitral valve repair.  He will not need coronary revascularization.  The radial sheath was removed and a TR band was placed on the right wrist to achieve patent hemostasis.  The patient left lab in stable condition.  He will be discharged home later today as an outpatient and will see me back in the office in 1 week for follow-up at which time I will refer him back to Dr. Roxy Manns to complete his work-up.  Quay Burow. MD, [facility] 02/19/2021 2:14 PM      Recommendations  Antiplatelet/Anticoag Recommend Aspirin 81mg  daily for moderate CAD.   Indications  Severe mitral regurgitation [I34.0 (ICD-10-CM)]   Procedural Details  Technical Details PROCEDURE DESCRIPTION:   The patient was brought to the second floor [facility] lab in the postabsorptive state. He was not premedicated . His right wrist and antecubital fossaWere prepped and shaved in usual sterile fashion. Xylocaine 1% was used for local anesthesia. A 6 French sheath was inserted into the right radial artery using standard Seldinger technique.  A 7 French sheath was inserted into the right antecubital vein.  A 7 [place] balloontipped thermal lesion Swan-Ganz catheter was then advanced through the right heart chambers obtaining sequential pressures and pulmonary artery blood samples further determination of Fick cardiac output.  5 [place] TIG catheter, right Judkins catheter and pigtail catheters were used for selective coronary angiography and obtain left heart pressures.  The patient received 4500 units  of heparin intravenously.  Isovue dye  was used for the entirety of the case.  Retrograde aortic and left ventricular and pullback pressures were recorded. Radial cocktail was administered via the SideArm sheath. Estimated blood loss <50 mL.   During this procedure no sedation was administered.   Medications (Filter: Administrations occurring from 1315 to 1413 on 02/19/21)  lidocaine (PF) (XYLOCAINE) 1 % injection (mL) Total volume:  4 mL  Date/Time Rate/Dose/Volume Action   02/19/21 1332 4 mL Given    Radial Cocktail (Verapamil 5 mg, NTG, Lidocaine) (mL) Total volume:  5 mL  Date/Time Rate/Dose/Volume Action   02/19/21 1337 5 mL Canceled Entry   1344 5 mL Given    Heparin (Porcine) in NaCl 1000-0.9 UT/500ML-% SOLN (mL) Total volume:  1,000 mL  Date/Time Rate/Dose/Volume Action   02/19/21 1337 500 mL Given   1337 500 mL Given    heparin sodium (porcine) injection (Units) Total dose:  4,500 Units  Date/Time Rate/Dose/Volume Action   02/19/21 1346 4,500 Units Given    iohexol (OMNIPAQUE) 350 MG/ML injection (mL) Total volume:  60 mL  Date/Time Rate/Dose/Volume Action   02/19/21 1404 60 mL Given     Contrast  Medication Name Total Dose  iohexol (OMNIPAQUE) 350 MG/ML injection 60 mL    Radiation/Fluoro  Fluoro time: 9.2 (min) DAP: 42683 (mGycm2) Cumulative Air Kerma: 419 (mGy)   Coronary Findings   Diagnostic Dominance: Left  Left Anterior Descending  Mid LAD-1 lesion is 40% stenosed.  Mid LAD-2 lesion is 40% stenosed.  Right Coronary Artery  Prox RCA lesion is 40% stenosed.   Intervention   No interventions have been documented.  Right Heart  Right Heart Pressures Hemodynamic findings consistent with mitral valve regurgitation. Right atrial pressure-9/7 Right ventricular pressure-33/3 Pulmonary artery pressure-27/13, mean 19 Pulmonary artery wedge pressure-A-wave 23, V wave 27, mean 20 LVEDP-23 Cardiac output-4.6 L/min with an index of 2.2 L/min/m by Fick   Coronary  Diagrams   Diagnostic Dominance: Left    Intervention    Implants    No implant documentation for this case.    Syngo Images  Show images for CARDIAC CATHETERIZATION  Images on Long Term Storage  Show images for Grunwald, Xylon M  Link to Procedure Log  Procedure Log     Hemo Data  Flowsheet Row Most Recent Value  Fick Cardiac Output 4.62 L/min  Fick Cardiac Output Index 2.21 (L/min)/BSA  RA A Wave 9 mmHg  RA V Wave 7 mmHg  RA Mean 6 mmHg  RV Systolic Pressure 33 mmHg  RV Diastolic Pressure 3 mmHg  RV EDP 7 mmHg  PA Systolic Pressure 27 mmHg  PA Diastolic Pressure 13 mmHg  PA Mean 19 mmHg  PW A Wave 23 mmHg  PW V Wave 27 mmHg  PW Mean 20 mmHg  AO Systolic Pressure 846 mmHg  AO Diastolic Pressure 962 mmHg  AO Mean 952 mmHg  LV Systolic Pressure 841 mmHg  LV Diastolic Pressure 10 mmHg  LV EDP 23 mmHg  AOp Systolic Pressure 324 mmHg  AOp Diastolic Pressure 99 mmHg  AOp Mean Pressure 401 mmHg  LVp Systolic Pressure 027 mmHg  LVp Diastolic Pressure 11 mmHg  LVp EDP Pressure 23 mmHg  QP/QS 1  TPVR Index 8.6 HRUI  TSVR Index 56.55 HRUI  TPVR/TSVR Ratio 0.15      Impression:  Patient has mitral valve prolapse with at least stage C possibly bordering 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  He reports no significant exertional shortness of breath or chest discomfort although he has noticed what he feels is a significant decline in his exercise capacity and endurance.  He has also had reproducible episodes of tachypalpitations that are always initiated by strenuous physical exertion.  I have personally reviewed the patient's recent transthoracic and transesophageal echocardiograms, diagnostic cardiac catheterization, and CT angiogram.  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  Although quantification of the severity of mitral regurgitation was challenging due to the eccentric nature of the jet, estimates of regurgitant volume ranged between 81 and 90 mL with PISA radius measured 1.20 cm.  Left ventricular systolic function appears normal.  The aortic valve appears normal.  Right ventricular size and function appears normal.  The tricuspid annulus does not appear dilated and there was only mild tricuspid regurgitation.  Catheterization reveals nonobstructive coronary artery disease and CT angiogram revealed no contraindication to peripheral cannulation for surgery.  Options include elective mitral valve repair versus continued observation with close medical follow-up.  Based upon review of the patient's recent TEE I feel that the patient's valve should be repairable with greater than 98% confidence with anticipated risk of mortality well below 1%.  The patient would need to  undergo diagnostic cardiac catheterization prior to any type of surgical intervention to rule out the presence of coronary artery disease.    The patient appears to be a good candidate for minimally invasive approach for surgery.   Plan:  The patient was again counseled at length regarding his diagnosis of severe primary mitral regurgitation.  We reviewed the results of their diagnostic tests including images from the most recent echocardiogram.  We discussed the natural history of mitral regurgitation as well as alternative treatment strategies.  We discussed the impact of his age, current state of health, and lack of significant comorbid medical problems on clinical decision making.  We went on to discuss the indications, risks and potential benefits of mitral valve repair as well as the timing of surgical intervention.  The rationale for elective surgery has been explained, including a comparison between surgery and continued  medical therapy with close follow-up.  The likelihood of successful and durable mitral valve repair has been discussed with particular reference to the findings of the most recent echocardiogram.  Based upon these findings and previous experience, I have quoted a greater than 98 percent likelihood of successful valve repair with less than 1 percent risk of mortality or major morbidity.  Alternative surgical approaches have been discussed including a comparison between conventional sternotomy and minimally-invasive techniques.  The relative risks and benefits of each have been reviewed as they pertain to the patient's specific circumstances, and expectations for the patient's postoperative convalescence has been discussed.  We also discussed the fact that although anticipated risks of surgery would be relatively low there remain significant risks including but not limited to risk of death, stroke or other neurologic complication, myocardial infarction, congestive heart failure, respiratory failure, renal failure, bleeding requiring transfusion and/or reexploration, arrhythmia, heart block or bradycardia requiring permanent pacemaker insertion, infection or other wound complications, pneumonia, pleural and/or pericardial effusion, pulmonary embolus, aortic dissection or other major vascular complication, or other immediate or delayed complications related to valve repair or replacement including but not limited to recurrent or persistent mitral regurgitation and/or mitral stenosis, LV outflow tract obstruction, aortic insufficiency, paravalvular leak, posterior AV groove disruption, structural valve deterioration and failure, thrombosis, embolization, or endocarditis.  Specific risks potentially related to the minimally-invasive approach were discussed at length, including but not limited to risk of conversion to full or partial sternotomy, aortic dissection or other major vascular complication, unilateral acute lung  injury or pulmonary edema, phrenic nerve dysfunction or paralysis, rib fracture, chronic pain, lung hernia, or lymphocele. All of his questions have been answered.  We plan to proceed with surgery on Wednesday, February 25, 2021.    I spent in excess of 15 minutes during the conduct of this office consultation and >50% of this time involved direct face-to-face encounter with the patient for counseling and/or coordination of their care.    Valentina Gu. Roxy Manns, MD 02/23/2021 12:56 PM

## 2021-02-24 ENCOUNTER — Encounter (HOSPITAL_COMMUNITY): Payer: Self-pay | Admitting: Thoracic Surgery (Cardiothoracic Vascular Surgery)

## 2021-02-24 MED ORDER — EPINEPHRINE HCL 5 MG/250ML IV SOLN IN NS
0.0000 ug/min | INTRAVENOUS | Status: DC
  Filled 2021-02-24: qty 250

## 2021-02-24 MED ORDER — SODIUM CHLORIDE 0.9 % IV SOLN
1.5000 g | INTRAVENOUS | Status: AC
  Administered 2021-02-25: 1.5 g via INTRAVENOUS
  Filled 2021-02-24: qty 1.5

## 2021-02-24 MED ORDER — MANNITOL 20 % IV SOLN
Freq: Once | INTRAVENOUS | Status: DC
  Filled 2021-02-24: qty 13

## 2021-02-24 MED ORDER — POTASSIUM CHLORIDE 2 MEQ/ML IV SOLN
80.0000 meq | INTRAVENOUS | Status: DC
  Filled 2021-02-24: qty 40

## 2021-02-24 MED ORDER — TRANEXAMIC ACID 1000 MG/10ML IV SOLN
1.5000 mg/kg/h | INTRAVENOUS | Status: AC
  Administered 2021-02-25: 1.5 mg/kg/h via INTRAVENOUS
  Filled 2021-02-24: qty 25

## 2021-02-24 MED ORDER — PLASMA-LYTE 148 IV SOLN
INTRAVENOUS | Status: DC
  Filled 2021-02-24: qty 2.5

## 2021-02-24 MED ORDER — TRANEXAMIC ACID (OHS) BOLUS VIA INFUSION
15.0000 mg/kg | INTRAVENOUS | Status: AC
  Administered 2021-02-25: 1395 mg via INTRAVENOUS
  Filled 2021-02-24: qty 1395

## 2021-02-24 MED ORDER — VANCOMYCIN HCL 1500 MG/300ML IV SOLN
1500.0000 mg | INTRAVENOUS | Status: AC
  Administered 2021-02-25: 1500 mg via INTRAVENOUS
  Filled 2021-02-24: qty 300

## 2021-02-24 MED ORDER — INSULIN REGULAR(HUMAN) IN NACL 100-0.9 UT/100ML-% IV SOLN
INTRAVENOUS | Status: AC
  Administered 2021-02-25: 1.3 [IU]/h via INTRAVENOUS
  Filled 2021-02-24: qty 100

## 2021-02-24 MED ORDER — SODIUM CHLORIDE 0.9 % IV SOLN
750.0000 mg | INTRAVENOUS | Status: AC
  Administered 2021-02-25: 750 mg via INTRAVENOUS
  Filled 2021-02-24: qty 750

## 2021-02-24 MED ORDER — NOREPINEPHRINE 4 MG/250ML-% IV SOLN
0.0000 ug/min | INTRAVENOUS | Status: DC
  Filled 2021-02-24: qty 250

## 2021-02-24 MED ORDER — MILRINONE LACTATE IN DEXTROSE 20-5 MG/100ML-% IV SOLN
0.3000 ug/kg/min | INTRAVENOUS | Status: DC
  Filled 2021-02-24: qty 100

## 2021-02-24 MED ORDER — VANCOMYCIN HCL 1000 MG IV SOLR
INTRAVENOUS | Status: DC
  Filled 2021-02-24: qty 1000

## 2021-02-24 MED ORDER — NITROGLYCERIN IN D5W 200-5 MCG/ML-% IV SOLN
2.0000 ug/min | INTRAVENOUS | Status: DC
  Administered 2021-02-25: 15 ug/min via INTRAVENOUS
  Filled 2021-02-24: qty 250

## 2021-02-24 MED ORDER — PHENYLEPHRINE HCL-NACL 20-0.9 MG/250ML-% IV SOLN
30.0000 ug/min | INTRAVENOUS | Status: AC
  Administered 2021-02-25: 15 ug/min via INTRAVENOUS
  Filled 2021-02-24: qty 250

## 2021-02-24 MED ORDER — DEXMEDETOMIDINE HCL IN NACL 400 MCG/100ML IV SOLN
0.1000 ug/kg/h | INTRAVENOUS | Status: AC
  Administered 2021-02-25: .7 ug/kg/h via INTRAVENOUS
  Filled 2021-02-24: qty 100

## 2021-02-24 MED ORDER — SODIUM CHLORIDE 0.9 % IV SOLN
INTRAVENOUS | Status: DC
  Filled 2021-02-24: qty 30

## 2021-02-24 MED ORDER — GLUTARALDEHYDE 0.625% SOAKING SOLUTION
TOPICAL | Status: DC
  Filled 2021-02-24: qty 50

## 2021-02-24 MED ORDER — TRANEXAMIC ACID (OHS) PUMP PRIME SOLUTION
2.0000 mg/kg | INTRAVENOUS | Status: DC
  Filled 2021-02-24: qty 1.86

## 2021-02-25 ENCOUNTER — Other Ambulatory Visit: Payer: Self-pay

## 2021-02-25 ENCOUNTER — Inpatient Hospital Stay (HOSPITAL_COMMUNITY)
Admission: RE | Admit: 2021-02-25 | Discharge: 2021-03-02 | DRG: 219 | Disposition: A | Discharge status: Home / Self Care | Payer: Medicare Other | Attending: Thoracic Surgery (Cardiothoracic Vascular Surgery) | Admitting: Thoracic Surgery (Cardiothoracic Vascular Surgery)

## 2021-02-25 ENCOUNTER — Inpatient Hospital Stay (HOSPITAL_COMMUNITY): Payer: Medicare Other

## 2021-02-25 ENCOUNTER — Encounter (HOSPITAL_COMMUNITY)
Admission: RE | Disposition: A | Discharge status: Home / Self Care | Payer: Self-pay | Source: Home / Self Care | Attending: Thoracic Surgery (Cardiothoracic Vascular Surgery)

## 2021-02-25 ENCOUNTER — Inpatient Hospital Stay (HOSPITAL_COMMUNITY): Payer: Medicare Other | Admitting: Physician Assistant

## 2021-02-25 ENCOUNTER — Encounter (HOSPITAL_COMMUNITY): Payer: Self-pay | Admitting: Thoracic Surgery (Cardiothoracic Vascular Surgery)

## 2021-02-25 ENCOUNTER — Inpatient Hospital Stay (HOSPITAL_COMMUNITY): Payer: Medicare Other | Admitting: Certified Registered Nurse Anesthetist

## 2021-02-25 DIAGNOSIS — I4891 Unspecified atrial fibrillation: Secondary | ICD-10-CM | POA: Diagnosis not present

## 2021-02-25 DIAGNOSIS — Z79899 Other long term (current) drug therapy: Secondary | ICD-10-CM | POA: Diagnosis not present

## 2021-02-25 DIAGNOSIS — D6959 Other secondary thrombocytopenia: Secondary | ICD-10-CM | POA: Diagnosis not present

## 2021-02-25 DIAGNOSIS — J939 Pneumothorax, unspecified: Secondary | ICD-10-CM

## 2021-02-25 DIAGNOSIS — I511 Rupture of chordae tendineae, not elsewhere classified: Secondary | ICD-10-CM | POA: Diagnosis present

## 2021-02-25 DIAGNOSIS — D62 Acute posthemorrhagic anemia: Secondary | ICD-10-CM | POA: Diagnosis not present

## 2021-02-25 DIAGNOSIS — I081 Rheumatic disorders of both mitral and tricuspid valves: Secondary | ICD-10-CM | POA: Diagnosis present

## 2021-02-25 DIAGNOSIS — I340 Nonrheumatic mitral (valve) insufficiency: Secondary | ICD-10-CM | POA: Diagnosis present

## 2021-02-25 DIAGNOSIS — I2510 Atherosclerotic heart disease of native coronary artery without angina pectoris: Secondary | ICD-10-CM | POA: Diagnosis present

## 2021-02-25 DIAGNOSIS — N400 Enlarged prostate without lower urinary tract symptoms: Secondary | ICD-10-CM | POA: Diagnosis present

## 2021-02-25 DIAGNOSIS — K5900 Constipation, unspecified: Secondary | ICD-10-CM | POA: Diagnosis not present

## 2021-02-25 DIAGNOSIS — E8770 Fluid overload, unspecified: Secondary | ICD-10-CM | POA: Diagnosis not present

## 2021-02-25 DIAGNOSIS — J9383 Other pneumothorax: Secondary | ICD-10-CM | POA: Diagnosis not present

## 2021-02-25 DIAGNOSIS — E785 Hyperlipidemia, unspecified: Secondary | ICD-10-CM | POA: Diagnosis present

## 2021-02-25 DIAGNOSIS — Z7982 Long term (current) use of aspirin: Secondary | ICD-10-CM

## 2021-02-25 DIAGNOSIS — Z4682 Encounter for fitting and adjustment of non-vascular catheter: Secondary | ICD-10-CM

## 2021-02-25 DIAGNOSIS — Z20822 Contact with and (suspected) exposure to covid-19: Secondary | ICD-10-CM | POA: Diagnosis present

## 2021-02-25 DIAGNOSIS — E663 Overweight: Secondary | ICD-10-CM | POA: Diagnosis present

## 2021-02-25 DIAGNOSIS — J9811 Atelectasis: Secondary | ICD-10-CM

## 2021-02-25 DIAGNOSIS — Z6830 Body mass index (BMI) 30.0-30.9, adult: Secondary | ICD-10-CM

## 2021-02-25 DIAGNOSIS — I10 Essential (primary) hypertension: Secondary | ICD-10-CM | POA: Diagnosis present

## 2021-02-25 DIAGNOSIS — I341 Nonrheumatic mitral (valve) prolapse: Secondary | ICD-10-CM

## 2021-02-25 DIAGNOSIS — Z9889 Other specified postprocedural states: Secondary | ICD-10-CM

## 2021-02-25 DIAGNOSIS — R001 Bradycardia, unspecified: Secondary | ICD-10-CM | POA: Diagnosis not present

## 2021-02-25 HISTORY — PX: TEE WITHOUT CARDIOVERSION: SHX5443

## 2021-02-25 HISTORY — PX: MITRAL VALVE REPAIR: SHX2039

## 2021-02-25 HISTORY — DX: Other specified postprocedural states: Z98.890

## 2021-02-25 LAB — POCT I-STAT 7, (LYTES, BLD GAS, ICA,H+H)
Acid-Base Excess: 0 mmol/L (ref 0.0–2.0)
Acid-Base Excess: 1 mmol/L (ref 0.0–2.0)
Acid-Base Excess: 3 mmol/L — ABNORMAL HIGH (ref 0.0–2.0)
Acid-base deficit: 3 mmol/L — ABNORMAL HIGH (ref 0.0–2.0)
Acid-base deficit: 6 mmol/L — ABNORMAL HIGH (ref 0.0–2.0)
Acid-base deficit: 1 mmol/L (ref 0.0–2.0)
Bicarbonate: 27.5 mmol/L (ref 20.0–28.0)
Bicarbonate: 27.8 mmol/L (ref 20.0–28.0)
Bicarbonate: 27.7 mmol/L (ref 20.0–28.0)
Bicarbonate: 23 mmol/L (ref 20.0–28.0)
Bicarbonate: 19.8 mmol/L — ABNORMAL LOW (ref 20.0–28.0)
Bicarbonate: 23.8 mmol/L (ref 20.0–28.0)
Calcium, Ion: 1.28 mmol/L (ref 1.15–1.40)
Calcium, Ion: 1.12 mmol/L — ABNORMAL LOW (ref 1.15–1.40)
Calcium, Ion: 1.09 mmol/L — ABNORMAL LOW (ref 1.15–1.40)
Calcium, Ion: 1.05 mmol/L — ABNORMAL LOW (ref 1.15–1.40)
Calcium, Ion: 0.98 mmol/L — ABNORMAL LOW (ref 1.15–1.40)
Calcium, Ion: 1.02 mmol/L — ABNORMAL LOW (ref 1.15–1.40)
HCT: 43 % (ref 39.0–52.0)
HCT: 36 % — ABNORMAL LOW (ref 39.0–52.0)
HCT: 33 % — ABNORMAL LOW (ref 39.0–52.0)
HCT: 33 % — ABNORMAL LOW (ref 39.0–52.0)
HCT: 34 % — ABNORMAL LOW (ref 39.0–52.0)
HCT: 35 % — ABNORMAL LOW (ref 39.0–52.0)
Hemoglobin: 14.6 g/dL (ref 13.0–17.0)
Hemoglobin: 12.2 g/dL — ABNORMAL LOW (ref 13.0–17.0)
Hemoglobin: 11.2 g/dL — ABNORMAL LOW (ref 13.0–17.0)
Hemoglobin: 11.2 g/dL — ABNORMAL LOW (ref 13.0–17.0)
Hemoglobin: 11.6 g/dL — ABNORMAL LOW (ref 13.0–17.0)
Hemoglobin: 11.9 g/dL — ABNORMAL LOW (ref 13.0–17.0)
O2 Saturation: 100 %
O2 Saturation: 100 %
O2 Saturation: 100 %
O2 Saturation: 99 %
O2 Saturation: 97 %
O2 Saturation: 94 %
Patient temperature: 35.8
Patient temperature: 37.6
Potassium: 3.7 mmol/L (ref 3.5–5.1)
Potassium: 4.1 mmol/L (ref 3.5–5.1)
Potassium: 4.1 mmol/L (ref 3.5–5.1)
Potassium: 3.4 mmol/L — ABNORMAL LOW (ref 3.5–5.1)
Potassium: 2.7 mmol/L — CL (ref 3.5–5.1)
Potassium: 4.7 mmol/L (ref 3.5–5.1)
Sodium: 138 mmol/L (ref 135–145)
Sodium: 139 mmol/L (ref 135–145)
Sodium: 139 mmol/L (ref 135–145)
Sodium: 142 mmol/L (ref 135–145)
Sodium: 144 mmol/L (ref 135–145)
Sodium: 139 mmol/L (ref 135–145)
TCO2: 29 mmol/L (ref 22–32)
TCO2: 29 mmol/L (ref 22–32)
TCO2: 29 mmol/L (ref 22–32)
TCO2: 24 mmol/L (ref 22–32)
TCO2: 21 mmol/L — ABNORMAL LOW (ref 22–32)
TCO2: 25 mmol/L (ref 22–32)
pCO2 arterial: 52.7 mmHg — ABNORMAL HIGH (ref 32.0–48.0)
pCO2 arterial: 52.6 mmHg — ABNORMAL HIGH (ref 32.0–48.0)
pCO2 arterial: 44.2 mmHg (ref 32.0–48.0)
pCO2 arterial: 42.9 mmHg (ref 32.0–48.0)
pCO2 arterial: 37.4 mmHg (ref 32.0–48.0)
pCO2 arterial: 40.2 mmHg (ref 32.0–48.0)
pH, Arterial: 7.325 — ABNORMAL LOW (ref 7.350–7.450)
pH, Arterial: 7.332 — ABNORMAL LOW (ref 7.350–7.450)
pH, Arterial: 7.405 (ref 7.350–7.450)
pH, Arterial: 7.336 — ABNORMAL LOW (ref 7.350–7.450)
pH, Arterial: 7.326 — ABNORMAL LOW (ref 7.350–7.450)
pH, Arterial: 7.382 (ref 7.350–7.450)
pO2, Arterial: 306 mmHg — ABNORMAL HIGH (ref 83.0–108.0)
pO2, Arterial: 458 mmHg — ABNORMAL HIGH (ref 83.0–108.0)
pO2, Arterial: 397 mmHg — ABNORMAL HIGH (ref 83.0–108.0)
pO2, Arterial: 125 mmHg — ABNORMAL HIGH (ref 83.0–108.0)
pO2, Arterial: 95 mmHg (ref 83.0–108.0)
pO2, Arterial: 75 mmHg — ABNORMAL LOW (ref 83.0–108.0)

## 2021-02-25 LAB — POCT I-STAT, CHEM 8
BUN: 16 mg/dL (ref 8–23)
BUN: 15 mg/dL (ref 8–23)
BUN: 13 mg/dL (ref 8–23)
BUN: 13 mg/dL (ref 8–23)
BUN: 11 mg/dL (ref 8–23)
Calcium, Ion: 1.32 mmol/L (ref 1.15–1.40)
Calcium, Ion: 1.3 mmol/L (ref 1.15–1.40)
Calcium, Ion: 1.13 mmol/L — ABNORMAL LOW (ref 1.15–1.40)
Calcium, Ion: 1.08 mmol/L — ABNORMAL LOW (ref 1.15–1.40)
Calcium, Ion: 1.06 mmol/L — ABNORMAL LOW (ref 1.15–1.40)
Chloride: 100 mmol/L (ref 98–111)
Chloride: 101 mmol/L (ref 98–111)
Chloride: 102 mmol/L (ref 98–111)
Chloride: 103 mmol/L (ref 98–111)
Chloride: 103 mmol/L (ref 98–111)
Creatinine, Ser: 0.9 mg/dL (ref 0.61–1.24)
Creatinine, Ser: 0.8 mg/dL (ref 0.61–1.24)
Creatinine, Ser: 0.7 mg/dL (ref 0.61–1.24)
Creatinine, Ser: 0.6 mg/dL — ABNORMAL LOW (ref 0.61–1.24)
Creatinine, Ser: 0.7 mg/dL (ref 0.61–1.24)
Glucose, Bld: 120 mg/dL — ABNORMAL HIGH (ref 70–99)
Glucose, Bld: 119 mg/dL — ABNORMAL HIGH (ref 70–99)
Glucose, Bld: 115 mg/dL — ABNORMAL HIGH (ref 70–99)
Glucose, Bld: 137 mg/dL — ABNORMAL HIGH (ref 70–99)
Glucose, Bld: 115 mg/dL — ABNORMAL HIGH (ref 70–99)
HCT: 42 % (ref 39.0–52.0)
HCT: 40 % (ref 39.0–52.0)
HCT: 33 % — ABNORMAL LOW (ref 39.0–52.0)
HCT: 33 % — ABNORMAL LOW (ref 39.0–52.0)
HCT: 31 % — ABNORMAL LOW (ref 39.0–52.0)
Hemoglobin: 14.3 g/dL (ref 13.0–17.0)
Hemoglobin: 13.6 g/dL (ref 13.0–17.0)
Hemoglobin: 11.2 g/dL — ABNORMAL LOW (ref 13.0–17.0)
Hemoglobin: 11.2 g/dL — ABNORMAL LOW (ref 13.0–17.0)
Hemoglobin: 10.5 g/dL — ABNORMAL LOW (ref 13.0–17.0)
Potassium: 3.8 mmol/L (ref 3.5–5.1)
Potassium: 4 mmol/L (ref 3.5–5.1)
Potassium: 4.3 mmol/L (ref 3.5–5.1)
Potassium: 4.4 mmol/L (ref 3.5–5.1)
Potassium: 3.5 mmol/L (ref 3.5–5.1)
Sodium: 139 mmol/L (ref 135–145)
Sodium: 139 mmol/L (ref 135–145)
Sodium: 139 mmol/L (ref 135–145)
Sodium: 138 mmol/L (ref 135–145)
Sodium: 141 mmol/L (ref 135–145)
TCO2: 29 mmol/L (ref 22–32)
TCO2: 26 mmol/L (ref 22–32)
TCO2: 27 mmol/L (ref 22–32)
TCO2: 27 mmol/L (ref 22–32)
TCO2: 24 mmol/L (ref 22–32)

## 2021-02-25 LAB — PROTIME-INR
INR: 1.4 — ABNORMAL HIGH (ref 0.8–1.2)
Prothrombin Time: 16.2 seconds — ABNORMAL HIGH (ref 11.4–15.2)

## 2021-02-25 LAB — POCT I-STAT EG7
Acid-Base Excess: 1 mmol/L (ref 0.0–2.0)
Bicarbonate: 28.3 mmol/L — ABNORMAL HIGH (ref 20.0–28.0)
Calcium, Ion: 1.12 mmol/L — ABNORMAL LOW (ref 1.15–1.40)
HCT: 34 % — ABNORMAL LOW (ref 39.0–52.0)
Hemoglobin: 11.6 g/dL — ABNORMAL LOW (ref 13.0–17.0)
O2 Saturation: 89 %
Potassium: 4.1 mmol/L (ref 3.5–5.1)
Sodium: 141 mmol/L (ref 135–145)
TCO2: 30 mmol/L (ref 22–32)
pCO2, Ven: 56.8 mmHg (ref 44.0–60.0)
pH, Ven: 7.305 (ref 7.250–7.430)
pO2, Ven: 62 mmHg — ABNORMAL HIGH (ref 32.0–45.0)

## 2021-02-25 LAB — BASIC METABOLIC PANEL
Anion gap: 6 (ref 5–15)
BUN: 10 mg/dL (ref 8–23)
CO2: 22 mmol/L (ref 22–32)
Calcium: 6.4 mg/dL — CL (ref 8.9–10.3)
Chloride: 109 mmol/L (ref 98–111)
Creatinine, Ser: 0.64 mg/dL (ref 0.61–1.24)
GFR, Estimated: >60 mL/min (ref >60)
Glucose, Bld: 137 mg/dL — ABNORMAL HIGH (ref 70–99)
Potassium: 3.2 mmol/L — ABNORMAL LOW (ref 3.5–5.1)
Sodium: 137 mmol/L (ref 135–145)

## 2021-02-25 LAB — GLUCOSE, CAPILLARY
Glucose-Capillary: 102 mg/dL — ABNORMAL HIGH (ref 70–99)
Glucose-Capillary: 146 mg/dL — ABNORMAL HIGH (ref 70–99)
Glucose-Capillary: 134 mg/dL — ABNORMAL HIGH (ref 70–99)
Glucose-Capillary: 131 mg/dL — ABNORMAL HIGH (ref 70–99)
Glucose-Capillary: 117 mg/dL — ABNORMAL HIGH (ref 70–99)
Glucose-Capillary: 151 mg/dL — ABNORMAL HIGH (ref 70–99)
Glucose-Capillary: 103 mg/dL — ABNORMAL HIGH (ref 70–99)
Glucose-Capillary: 123 mg/dL — ABNORMAL HIGH (ref 70–99)

## 2021-02-25 LAB — ECHO INTRAOPERATIVE TEE
Height: 70 in
Weight: 3216 oz

## 2021-02-25 LAB — CBC
HCT: 39.3 % (ref 39.0–52.0)
HCT: 40 % (ref 39.0–52.0)
Hemoglobin: 13.6 g/dL (ref 13.0–17.0)
Hemoglobin: 13.9 g/dL (ref 13.0–17.0)
MCH: 30.8 pg (ref 26.0–34.0)
MCH: 30.9 pg (ref 26.0–34.0)
MCHC: 34.6 g/dL (ref 30.0–36.0)
MCHC: 34.8 g/dL (ref 30.0–36.0)
MCV: 89.1 fL (ref 80.0–100.0)
MCV: 88.9 fL (ref 80.0–100.0)
Platelets: 169 10*3/uL (ref 150–400)
Platelets: 180 10*3/uL (ref 150–400)
RBC: 4.41 MIL/uL (ref 4.22–5.81)
RBC: 4.5 MIL/uL (ref 4.22–5.81)
RDW: 12.9 % (ref 11.5–15.5)
RDW: 13 % (ref 11.5–15.5)
WBC: 24.7 10*3/uL — ABNORMAL HIGH (ref 4.0–10.5)
WBC: 21 10*3/uL — ABNORMAL HIGH (ref 4.0–10.5)
nRBC: 0 % (ref 0.0–0.2)
nRBC: 0 % (ref 0.0–0.2)

## 2021-02-25 LAB — MAGNESIUM: Magnesium: 2.4 mg/dL (ref 1.7–2.4)

## 2021-02-25 LAB — APTT: aPTT: 28 seconds (ref 24–36)

## 2021-02-25 LAB — ABO/RH: ABO/RH(D): AB POS

## 2021-02-25 SURGERY — REPAIR, MITRAL VALVE, MINIMALLY INVASIVE
Anesthesia: General | Site: Chest | Laterality: Right

## 2021-02-25 MED ORDER — PROPOFOL 10 MG/ML IV BOLUS
INTRAVENOUS | Status: DC | PRN
  Administered 2021-02-25: 200 mg via INTRAVENOUS

## 2021-02-25 MED ORDER — LACTATED RINGERS IV SOLN: INTRAVENOUS | Status: DC

## 2021-02-25 MED ORDER — LABETALOL HCL 5 MG/ML IV SOLN
10.0000 mg | INTRAVENOUS | Status: DC | PRN
Start: 2021-02-25 — End: 2021-03-02
  Administered 2021-02-25 – 2021-02-27 (×9): 10 mg via INTRAVENOUS
  Filled 2021-02-25 (×5): qty 4

## 2021-02-25 MED ORDER — LACTATED RINGERS IV SOLN: INTRAVENOUS | Status: DC | PRN

## 2021-02-25 MED ORDER — DUTASTERIDE 0.5 MG PO CAPS
0.5000 mg | ORAL_CAPSULE | Freq: Every day | ORAL | Status: DC
  Administered 2021-02-26 – 2021-03-02 (×5): 0.5 mg via ORAL
  Filled 2021-02-25 (×5): qty 1

## 2021-02-25 MED ORDER — METOPROLOL TARTRATE 12.5 MG HALF TABLET
ORAL_TABLET | ORAL | Status: AC
  Filled 2021-02-25: qty 1

## 2021-02-25 MED ORDER — DOCUSATE SODIUM 100 MG PO CAPS
200.0000 mg | ORAL_CAPSULE | Freq: Every day | ORAL | Status: DC
  Administered 2021-02-26 – 2021-03-02 (×4): 200 mg via ORAL
  Filled 2021-02-25 (×4): qty 2

## 2021-02-25 MED ORDER — PROPOFOL 10 MG/ML IV BOLUS
INTRAVENOUS | Status: AC
  Filled 2021-02-25: qty 20

## 2021-02-25 MED ORDER — OXYCODONE HCL 5 MG PO TABS
5.0000 mg | ORAL_TABLET | ORAL | Status: DC | PRN
  Administered 2021-02-26: 10 mg via ORAL
  Administered 2021-02-26: 5 mg via ORAL
  Filled 2021-02-25: qty 2
  Filled 2021-02-25: qty 1

## 2021-02-25 MED ORDER — HEPARIN SODIUM (PORCINE) 1000 UNIT/ML IJ SOLN
INTRAMUSCULAR | Status: AC
  Filled 2021-02-25: qty 1

## 2021-02-25 MED ORDER — PROTAMINE SULFATE 10 MG/ML IV SOLN
INTRAVENOUS | Status: AC
  Filled 2021-02-25: qty 5

## 2021-02-25 MED ORDER — FAMOTIDINE IN NACL 20-0.9 MG/50ML-% IV SOLN
20.0000 mg | Freq: Two times a day (BID) | INTRAVENOUS | Status: DC
  Administered 2021-02-25: 20 mg via INTRAVENOUS
  Filled 2021-02-25: qty 50

## 2021-02-25 MED ORDER — ONDANSETRON HCL 4 MG/2ML IJ SOLN
4.0000 mg | Freq: Four times a day (QID) | INTRAMUSCULAR | Status: DC | PRN
  Administered 2021-02-25 – 2021-02-27 (×2): 4 mg via INTRAVENOUS
  Filled 2021-02-25 (×3): qty 2

## 2021-02-25 MED ORDER — BUPIVACAINE HCL (PF) 0.5 % IJ SOLN
INTRAMUSCULAR | Status: AC
  Filled 2021-02-25: qty 30

## 2021-02-25 MED ORDER — MAGNESIUM SULFATE 4 GM/100ML IV SOLN
4.0000 g | Freq: Once | INTRAVENOUS | Status: AC
  Administered 2021-02-25: 4 g via INTRAVENOUS
  Filled 2021-02-25: qty 100

## 2021-02-25 MED ORDER — BISACODYL 10 MG RE SUPP: 10.0000 mg | Freq: Every day | RECTAL | Status: DC

## 2021-02-25 MED ORDER — VANCOMYCIN HCL IN DEXTROSE 1-5 GM/200ML-% IV SOLN
1000.0000 mg | Freq: Once | INTRAVENOUS | Status: AC
  Administered 2021-02-26: 1000 mg via INTRAVENOUS
  Filled 2021-02-25: qty 200

## 2021-02-25 MED ORDER — PLASMA-LYTE 148 IV SOLN
INTRAVENOUS | Status: DC | PRN
  Administered 2021-02-25: 500 mL via INTRAVASCULAR

## 2021-02-25 MED ORDER — DEXMEDETOMIDINE HCL IN NACL 400 MCG/100ML IV SOLN
0.0000 ug/kg/h | INTRAVENOUS | Status: DC
  Filled 2021-02-25: qty 100

## 2021-02-25 MED ORDER — ALBUMIN HUMAN 5 % IV SOLN: 250.0000 mL | INTRAVENOUS | Status: DC | PRN

## 2021-02-25 MED ORDER — ACETAMINOPHEN 650 MG RE SUPP
650.0000 mg | Freq: Once | RECTAL | Status: AC
  Administered 2021-02-25: 650 mg via RECTAL

## 2021-02-25 MED ORDER — ALBUMIN HUMAN 5 % IV SOLN: INTRAVENOUS | Status: DC | PRN

## 2021-02-25 MED ORDER — FENTANYL CITRATE (PF) 250 MCG/5ML IJ SOLN
INTRAMUSCULAR | Status: AC
  Filled 2021-02-25: qty 25

## 2021-02-25 MED ORDER — POTASSIUM CHLORIDE 10 MEQ/50ML IV SOLN
10.0000 meq | INTRAVENOUS | Status: AC
Start: 2021-02-25 — End: 2021-02-25
  Administered 2021-02-25 (×3): 10 meq via INTRAVENOUS

## 2021-02-25 MED ORDER — ONDANSETRON HCL 4 MG/2ML IJ SOLN
INTRAMUSCULAR | Status: DC | PRN
  Administered 2021-02-25: 4 mg via INTRAVENOUS

## 2021-02-25 MED ORDER — ACETAMINOPHEN 160 MG/5ML PO SOLN: 650.0000 mg | Freq: Once | ORAL | Status: AC

## 2021-02-25 MED ORDER — GLYCOPYRROLATE 0.2 MG/ML IJ SOLN
INTRAMUSCULAR | Status: DC | PRN
  Administered 2021-02-25 (×2): .1 mg via INTRAVENOUS

## 2021-02-25 MED ORDER — METOPROLOL TARTRATE 12.5 MG HALF TABLET: 12.5000 mg | ORAL_TABLET | Freq: Once | ORAL | Status: DC

## 2021-02-25 MED ORDER — ASPIRIN 81 MG PO CHEW: 324.0000 mg | CHEWABLE_TABLET | Freq: Every day | ORAL | Status: DC

## 2021-02-25 MED ORDER — SUGAMMADEX SODIUM 200 MG/2ML IV SOLN
INTRAVENOUS | Status: DC | PRN
  Administered 2021-02-25: 200 mg via INTRAVENOUS

## 2021-02-25 MED ORDER — METOPROLOL TARTRATE 5 MG/5ML IV SOLN
2.5000 mg | INTRAVENOUS | Status: DC | PRN
Start: 2021-02-25 — End: 2021-03-02

## 2021-02-25 MED ORDER — SODIUM CHLORIDE 0.9 % IV SOLN: INTRAVENOUS | Status: DC

## 2021-02-25 MED ORDER — CHLORHEXIDINE GLUCONATE 0.12 % MT SOLN
OROMUCOSAL | Status: AC
  Administered 2021-02-25: 15 mL via OROMUCOSAL
  Filled 2021-02-25: qty 15

## 2021-02-25 MED ORDER — SODIUM CHLORIDE 0.9% FLUSH: 3.0000 mL | INTRAVENOUS | Status: DC | PRN

## 2021-02-25 MED ORDER — MIDAZOLAM HCL 2 MG/2ML IJ SOLN: 2.0000 mg | INTRAMUSCULAR | Status: DC | PRN

## 2021-02-25 MED ORDER — ROCURONIUM BROMIDE 10 MG/ML (PF) SYRINGE
PREFILLED_SYRINGE | INTRAVENOUS | Status: DC | PRN
  Administered 2021-02-25: 50 mg via INTRAVENOUS
  Administered 2021-02-25 (×3): 30 mg via INTRAVENOUS

## 2021-02-25 MED ORDER — MORPHINE SULFATE (PF) 2 MG/ML IV SOLN
1.0000 mg | INTRAVENOUS | Status: DC | PRN
  Administered 2021-02-25: 4 mg via INTRAVENOUS
  Administered 2021-02-25: 3 mg via INTRAVENOUS
  Administered 2021-02-25 (×4): 4 mg via INTRAVENOUS
  Administered 2021-02-26 (×2): 2 mg via INTRAVENOUS
  Filled 2021-02-25 (×8): qty 2

## 2021-02-25 MED ORDER — TRAMADOL HCL 50 MG PO TABS
50.0000 mg | ORAL_TABLET | ORAL | Status: DC | PRN
  Administered 2021-02-26 (×2): 100 mg via ORAL
  Administered 2021-02-26: 50 mg via ORAL
  Administered 2021-02-27 – 2021-03-01 (×5): 100 mg via ORAL
  Filled 2021-02-25 (×5): qty 2
  Filled 2021-02-25: qty 1
  Filled 2021-02-25 (×2): qty 2

## 2021-02-25 MED ORDER — INSULIN REGULAR(HUMAN) IN NACL 100-0.9 UT/100ML-% IV SOLN: INTRAVENOUS | Status: DC

## 2021-02-25 MED ORDER — MIDAZOLAM HCL 5 MG/5ML IJ SOLN
INTRAMUSCULAR | Status: DC | PRN
  Administered 2021-02-25: 2 mg via INTRAVENOUS
  Administered 2021-02-25 (×3): 1 mg via INTRAVENOUS

## 2021-02-25 MED ORDER — POTASSIUM CHLORIDE 10 MEQ/50ML IV SOLN
10.0000 meq | INTRAVENOUS | Status: AC
  Administered 2021-02-25 – 2021-02-26 (×3): 10 meq via INTRAVENOUS
  Filled 2021-02-25 (×2): qty 50

## 2021-02-25 MED ORDER — DEXAMETHASONE SODIUM PHOSPHATE 10 MG/ML IJ SOLN
INTRAMUSCULAR | Status: DC | PRN
  Administered 2021-02-25: 10 mg via INTRAVENOUS

## 2021-02-25 MED ORDER — ORAL CARE MOUTH RINSE
15.0000 mL | Freq: Two times a day (BID) | OROMUCOSAL | Status: DC
  Administered 2021-02-25: 15 mL via OROMUCOSAL

## 2021-02-25 MED ORDER — CHLORHEXIDINE GLUCONATE CLOTH 2 % EX PADS
6.0000 | MEDICATED_PAD | Freq: Every day | CUTANEOUS | Status: DC
  Administered 2021-02-25 – 2021-02-27 (×3): 6 via TOPICAL

## 2021-02-25 MED ORDER — ASPIRIN EC 325 MG PO TBEC
325.0000 mg | DELAYED_RELEASE_TABLET | Freq: Every day | ORAL | Status: AC
  Administered 2021-02-26: 325 mg via ORAL
  Filled 2021-02-25: qty 1

## 2021-02-25 MED ORDER — MIDAZOLAM HCL (PF) 10 MG/2ML IJ SOLN
INTRAMUSCULAR | Status: AC
  Filled 2021-02-25: qty 2

## 2021-02-25 MED ORDER — SODIUM CHLORIDE 0.9 % IV SOLN: 250.0000 mL | INTRAVENOUS | Status: DC

## 2021-02-25 MED ORDER — SODIUM CHLORIDE 0.9 % IV SOLN
1.5000 g | Freq: Two times a day (BID) | INTRAVENOUS | Status: AC
  Administered 2021-02-25 – 2021-02-27 (×4): 1.5 g via INTRAVENOUS
  Filled 2021-02-25 (×4): qty 1.5

## 2021-02-25 MED ORDER — NITROGLYCERIN IN D5W 200-5 MCG/ML-% IV SOLN: 0.0000 ug/min | INTRAVENOUS | Status: DC

## 2021-02-25 MED ORDER — CHLORHEXIDINE GLUCONATE 0.12 % MT SOLN
15.0000 mL | Freq: Once | OROMUCOSAL | Status: AC
Start: 2021-02-26 — End: 2021-02-25

## 2021-02-25 MED ORDER — HEPARIN SODIUM (PORCINE) 1000 UNIT/ML IJ SOLN
INTRAMUSCULAR | Status: DC | PRN
  Administered 2021-02-25: 32000 [IU] via INTRAVENOUS

## 2021-02-25 MED ORDER — BISACODYL 5 MG PO TBEC
10.0000 mg | DELAYED_RELEASE_TABLET | Freq: Every day | ORAL | Status: DC
  Administered 2021-02-26 – 2021-02-28 (×3): 10 mg via ORAL
  Filled 2021-02-25 (×3): qty 2

## 2021-02-25 MED ORDER — ACETAMINOPHEN 160 MG/5ML PO SOLN: 1000.0000 mg | Freq: Four times a day (QID) | ORAL | Status: DC

## 2021-02-25 MED ORDER — VANCOMYCIN HCL 1000 MG IV SOLR
INTRAVENOUS | Status: DC | PRN
  Administered 2021-02-25: 1000 mL

## 2021-02-25 MED ORDER — CHLORHEXIDINE GLUCONATE 0.12 % MT SOLN
15.0000 mL | Freq: Two times a day (BID) | OROMUCOSAL | Status: DC
  Administered 2021-02-25: 15 mL via OROMUCOSAL

## 2021-02-25 MED ORDER — PANTOPRAZOLE SODIUM 40 MG PO TBEC
40.0000 mg | DELAYED_RELEASE_TABLET | Freq: Every day | ORAL | Status: DC
  Administered 2021-02-27 – 2021-03-02 (×4): 40 mg via ORAL
  Filled 2021-02-25 (×4): qty 1

## 2021-02-25 MED ORDER — LACTATED RINGERS IV SOLN: 500.0000 mL | Freq: Once | INTRAVENOUS | Status: DC | PRN

## 2021-02-25 MED ORDER — BUPIVACAINE LIPOSOME 1.3 % IJ SUSP
INTRAMUSCULAR | Status: AC
  Filled 2021-02-25: qty 20

## 2021-02-25 MED ORDER — FENTANYL CITRATE (PF) 250 MCG/5ML IJ SOLN
INTRAMUSCULAR | Status: DC | PRN
  Administered 2021-02-25 (×3): 100 ug via INTRAVENOUS
  Administered 2021-02-25: 50 ug via INTRAVENOUS
  Administered 2021-02-25: 100 ug via INTRAVENOUS
  Administered 2021-02-25: 50 ug via INTRAVENOUS
  Administered 2021-02-25: 100 ug via INTRAVENOUS
  Administered 2021-02-25: 50 ug via INTRAVENOUS
  Administered 2021-02-25 (×2): 100 ug via INTRAVENOUS

## 2021-02-25 MED ORDER — DEXTROSE 50 % IV SOLN: 0.0000 mL | INTRAVENOUS | Status: DC | PRN

## 2021-02-25 MED ORDER — ACETAMINOPHEN 500 MG PO TABS
1000.0000 mg | ORAL_TABLET | Freq: Four times a day (QID) | ORAL | Status: DC
  Administered 2021-02-26 – 2021-03-02 (×17): 1000 mg via ORAL
  Filled 2021-02-25 (×18): qty 2

## 2021-02-25 MED ORDER — 0.9 % SODIUM CHLORIDE (POUR BTL) OPTIME
TOPICAL | Status: DC | PRN
  Administered 2021-02-25: 5000 mL

## 2021-02-25 MED ORDER — SODIUM CHLORIDE 0.9% FLUSH
3.0000 mL | Freq: Two times a day (BID) | INTRAVENOUS | Status: DC
  Administered 2021-02-26 – 2021-03-01 (×7): 3 mL via INTRAVENOUS

## 2021-02-25 MED ORDER — PHENYLEPHRINE HCL-NACL 20-0.9 MG/250ML-% IV SOLN: 0.0000 ug/min | INTRAVENOUS | Status: DC

## 2021-02-25 MED ORDER — CHLORHEXIDINE GLUCONATE 0.12 % MT SOLN
15.0000 mL | OROMUCOSAL | Status: AC
  Administered 2021-02-25: 15 mL via OROMUCOSAL

## 2021-02-25 MED ORDER — ASPIRIN 81 MG PO CHEW
81.0000 mg | CHEWABLE_TABLET | Freq: Once | ORAL | Status: AC
  Administered 2021-02-27: 81 mg via ORAL
  Filled 2021-02-25: qty 1

## 2021-02-25 MED ORDER — CHLORHEXIDINE GLUCONATE 4 % EX LIQD: 30.0000 mL | CUTANEOUS | Status: DC

## 2021-02-25 MED ORDER — SODIUM CHLORIDE 0.45 % IV SOLN: INTRAVENOUS | Status: DC | PRN

## 2021-02-25 MED ORDER — BUPIVACAINE LIPOSOME 1.3 % IJ SUSP
INTRAMUSCULAR | Status: DC | PRN
  Administered 2021-02-25: 50 mL

## 2021-02-25 MED ORDER — PROTAMINE SULFATE 10 MG/ML IV SOLN
INTRAVENOUS | Status: DC | PRN
  Administered 2021-02-25: 10 mg via INTRAVENOUS
  Administered 2021-02-25: 310 mg via INTRAVENOUS

## 2021-02-25 MED ORDER — PROTAMINE SULFATE 10 MG/ML IV SOLN
INTRAVENOUS | Status: AC
  Filled 2021-02-25: qty 25

## 2021-02-25 SURGICAL SUPPLY — 105 items
ADAPTER CARDIO PERF ANTE/RETRO (ADAPTER) ×3 IMPLANT
ADH SKN CLS APL DERMABOND .7 (GAUZE/BANDAGES/DRESSINGS) ×3
ADPR PRFSN 84XANTGRD RTRGD (ADAPTER) ×2
BAG DECANTER FOR FLEXI CONT (MISCELLANEOUS) ×6 IMPLANT
BLADE CLIPPER SURG (BLADE) ×3 IMPLANT
BLADE SURG 11 STRL SS (BLADE) ×3 IMPLANT
CANISTER SUCT 3000ML PPV (MISCELLANEOUS) ×6 IMPLANT
CANNULA ADULT BIO-MEDICUS 15FR (CANNULA) ×3 IMPLANT
CANNULA FEM BIOMEDICUS 25FR (CANNULA) ×3 IMPLANT
CANNULA FEM VENOUS REMOTE 22FR (CANNULA) IMPLANT
CANNULA FEMORAL ART 14 SM (MISCELLANEOUS) ×3 IMPLANT
CANNULA GUNDRY RCSP 15FR (MISCELLANEOUS) ×3 IMPLANT
CANNULA OPTISITE PERFUSION 16F (CANNULA) IMPLANT
CANNULA OPTISITE PERFUSION 18F (CANNULA) ×3 IMPLANT
CANNULA SUMP PERICARDIAL (CANNULA) ×3 IMPLANT
CATH CPB KIT OWEN (MISCELLANEOUS) IMPLANT
CATH KIT ON-Q SILVERSOAK 5IN (CATHETERS) IMPLANT
CELLS DAT CNTRL 66122 CELL SVR (MISCELLANEOUS) ×2 IMPLANT
CLOSURE PERCLOSE PROSTYLE (VASCULAR PRODUCTS) ×12 IMPLANT
CNTNR URN SCR LID CUP LEK RST (MISCELLANEOUS) ×2 IMPLANT
CONN 1/2X3/8X3/8 Y GISH (MISCELLANEOUS) ×3 IMPLANT
CONN ST 1/4X3/8  BEN (MISCELLANEOUS) ×6
CONN ST 1/4X3/8 BEN (MISCELLANEOUS) ×4 IMPLANT
CONNECTOR 1/2X3/8X1/2 3 WAY (MISCELLANEOUS) ×3
CONNECTOR 1/2X3/8X1/2 3WAY (MISCELLANEOUS) ×2 IMPLANT
CONT SPEC 4OZ STRL OR WHT (MISCELLANEOUS) ×3
COVER BACK TABLE 24X17X13 BIG (DRAPES) ×3 IMPLANT
COVER PROBE W GEL 5X96 (DRAPES) ×3 IMPLANT
DERMABOND ADVANCED (GAUZE/BANDAGES/DRESSINGS) ×3
DERMABOND ADVANCED .7 DNX12 (GAUZE/BANDAGES/DRESSINGS) ×6 IMPLANT
DEVICE CLOSURE PERCLS PRGLD 6F (VASCULAR PRODUCTS) IMPLANT
DEVICE SUT CK QUICK LOAD INDV (Prosthesis & Implant Heart) ×9 IMPLANT
DEVICE SUT CK QUICK LOAD MINI (Prosthesis & Implant Heart) ×3 IMPLANT
DEVICE TROCAR PUNCTURE CLOSURE (ENDOMECHANICALS) ×3 IMPLANT
DRAIN CHANNEL 32F RND 10.7 FF (WOUND CARE) ×6 IMPLANT
DRAPE C-ARM 42X72 X-RAY (DRAPES) ×3 IMPLANT
DRAPE CV SPLIT W-CLR ANES SCRN (DRAPES) ×3 IMPLANT
DRAPE INCISE IOBAN 66X45 STRL (DRAPES) ×6 IMPLANT
DRAPE PERI GROIN 82X75IN TIB (DRAPES) ×3 IMPLANT
DRAPE SLUSH MACHINE 52X66 (DRAPES) ×3 IMPLANT
DRAPE SLUSH/WARMER DISC (DRAPES) IMPLANT
DRSG AQUACEL AG ADV 3.5X 6 (GAUZE/BANDAGES/DRESSINGS) ×3 IMPLANT
DRSG AQUACEL AG ADV 3.5X10 (GAUZE/BANDAGES/DRESSINGS) ×3 IMPLANT
ELECT BLADE 6.5 EXT (BLADE) ×3 IMPLANT
ELECT REM PT RETURN 9FT ADLT (ELECTROSURGICAL) ×6
ELECTRODE REM PT RTRN 9FT ADLT (ELECTROSURGICAL) ×4 IMPLANT
FELT TEFLON 1X6 (MISCELLANEOUS) ×3 IMPLANT
FEMORAL VENOUS CANN RAP (CANNULA) IMPLANT
GAUZE SPONGE 4X4 12PLY STRL (GAUZE/BANDAGES/DRESSINGS) ×3 IMPLANT
GAUZE SPONGE 4X4 12PLY STRL LF (GAUZE/BANDAGES/DRESSINGS) ×3 IMPLANT
GLOVE ORTHO TXT STRL SZ7.5 (GLOVE) ×9 IMPLANT
GLOVE SURG MICRO LTX SZ6.5 (GLOVE) ×3 IMPLANT
GLOVE SURG MICRO LTX SZ8 (GLOVE) ×3 IMPLANT
GOWN STRL REUS W/ TWL LRG LVL3 (GOWN DISPOSABLE) ×8 IMPLANT
GOWN STRL REUS W/TWL LRG LVL3 (GOWN DISPOSABLE) ×12
GRASPER SUT TROCAR 14GX15 (MISCELLANEOUS) ×3 IMPLANT
KIT BASIN OR (CUSTOM PROCEDURE TRAY) ×3 IMPLANT
KIT DILATOR VASC 18G NDL (KITS) ×3 IMPLANT
KIT DRAINAGE VACCUM ASSIST (KITS) ×3 IMPLANT
KIT SUCTION CATH 14FR (SUCTIONS) ×3 IMPLANT
KIT SUT CK MINI COMBO 4X17 (Prosthesis & Implant Heart) ×3 IMPLANT
KIT TURNOVER KIT B (KITS) ×3 IMPLANT
LEAD PACING MYOCARDI (MISCELLANEOUS) ×3 IMPLANT
LINE VENT (MISCELLANEOUS) ×3 IMPLANT
NEEDLE AORTIC ROOT 14G 7F (CATHETERS) ×3 IMPLANT
NS IRRIG 1000ML POUR BTL (IV SOLUTION) ×15 IMPLANT
PACK E MIN INVASIVE VALVE (SUTURE) ×3 IMPLANT
PACK OPEN HEART (CUSTOM PROCEDURE TRAY) ×3 IMPLANT
PAD ARMBOARD 7.5X6 YLW CONV (MISCELLANEOUS) ×6 IMPLANT
PAD ELECT DEFIB RADIOL ZOLL (MISCELLANEOUS) ×3 IMPLANT
PERCLOSE PROGLIDE 6F (VASCULAR PRODUCTS)
POSITIONER HEAD DONUT 9IN (MISCELLANEOUS) ×3 IMPLANT
RING MITRAL MEMO 4D 30 (Prosthesis & Implant Heart) ×3 IMPLANT
RTRCTR WOUND ALEXIS 18CM MED (MISCELLANEOUS) ×3
SET CANNULATION TOURNIQUET (MISCELLANEOUS) ×3 IMPLANT
SET CARDIOPLEGIA MPS 5001102 (MISCELLANEOUS) ×3 IMPLANT
SET IRRIG TUBING LAPAROSCOPIC (IRRIGATION / IRRIGATOR) ×3 IMPLANT
SET MICROPUNCTURE 5F STIFF (MISCELLANEOUS) ×3 IMPLANT
SHEATH PINNACLE 8F 10CM (SHEATH) ×9 IMPLANT
SOL ANTI FOG 6CC (MISCELLANEOUS) ×2 IMPLANT
SOLUTION ANTI FOG 6CC (MISCELLANEOUS) ×1
SUT BONE WAX W31G (SUTURE) ×3 IMPLANT
SUT EB EXC GRN/WHT 2-0 D/A SH (SUTURE) ×3
SUT ETHIBOND (SUTURE) ×6 IMPLANT
SUT ETHIBOND 2-0 RB-1 WHT (SUTURE) ×6 IMPLANT
SUT ETHIBOND X763 2 0 SH 1 (SUTURE) ×3 IMPLANT
SUT GORETEX CV 4 TH 22 36 (SUTURE) IMPLANT
SUT GORETEX CV4 TH-18 (SUTURE) IMPLANT
SUT PROLENE 3 0 SH1 36 (SUTURE) ×18 IMPLANT
SUT PTFE CHORD X 16MM (SUTURE) ×3 IMPLANT
SUTURE EB EXC GRN/WHT 2-0 D/A (SUTURE) ×2 IMPLANT
SYSTEM SAHARA CHEST DRAIN ATS (WOUND CARE) ×3 IMPLANT
TAPE CLOTH SURG 4X10 WHT LF (GAUZE/BANDAGES/DRESSINGS) ×3 IMPLANT
TAPE PAPER 2X10 WHT MICROPORE (GAUZE/BANDAGES/DRESSINGS) ×3 IMPLANT
TOWEL GREEN STERILE (TOWEL DISPOSABLE) ×3 IMPLANT
TOWEL GREEN STERILE FF (TOWEL DISPOSABLE) ×3 IMPLANT
TRAY FOLEY SLVR 16FR TEMP STAT (SET/KITS/TRAYS/PACK) ×3 IMPLANT
TROCAR XCEL BLADELESS 5X75MML (TROCAR) ×3 IMPLANT
TROCAR XCEL NON-BLD 11X100MML (ENDOMECHANICALS) ×6 IMPLANT
TUBE SUCT INTRACARD DLP 20F (MISCELLANEOUS) ×6 IMPLANT
TUBING EXTENTION W/L.L. (IV SETS) ×3 IMPLANT
TUNNELER SHEATH ON-Q 11GX8 DSP (PAIN MANAGEMENT) IMPLANT
UNDERPAD 30X36 HEAVY ABSORB (UNDERPADS AND DIAPERS) ×3 IMPLANT
WATER STERILE IRR 1000ML POUR (IV SOLUTION) ×6 IMPLANT
WIRE EMERALD 3MM-J .035X150CM (WIRE) ×3 IMPLANT

## 2021-02-25 NOTE — Progress Notes (Signed)
Pt with minimal urine output. Attempted irrigation with no change. Pt stated he felt like his bladder was full. Pt bladder scanned, 252 noted to be in bladder. Water removed from balloon and foley repositioned. Bloody urine noted with 300 ml output. Will continue to monitor.

## 2021-02-25 NOTE — Anesthesia Procedure Notes (Signed)
Central Venous Catheter Insertion Performed by: Roberts Gaudy, MD Start/End4/04/2021 8:40 AM, 02/25/2021 8:45 AM Position: supine Hand hygiene performed  and maximum sterile barriers used  Catheter size: 8 Fr Central line was placed.Triple lumen Procedure performed without using ultrasound guided technique. Attempts: 1 Following insertion, dressing applied and Biopatch. Post procedure assessment: blood return through all ports and free fluid flow  Patient tolerated the procedure well with no immediate complications.

## 2021-02-25 NOTE — Transfer of Care (Signed)
Immediate Anesthesia Transfer of Care Note  Patient: Ricky Fitzgerald  Procedure(s) Performed: MINIMALLY INVASIVE MITRAL VALVE REPAIR (MVR) USING MEMO 4D 30MM RING (Right Chest) TRANSESOPHAGEAL ECHOCARDIOGRAM (TEE) (N/A )  Patient Location: SICU  Anesthesia Type:General  Level of Consciousness: drowsy  Airway & Oxygen Therapy: Patient Spontanous Breathing and Patient connected to face mask oxygen  Post-op Assessment: Report given to RN and Post -op Vital signs reviewed and stable  Post vital signs: Reviewed and stable  Last Vitals:  Vitals Value Taken Time  BP    Temp 35.8 C 02/25/21 1506  Pulse 91 02/25/21 1505  Resp 14 02/25/21 1506  SpO2 100 % 02/25/21 1505  Vitals shown include unvalidated device data.  Last Pain:  Vitals:   02/25/21 0658  TempSrc: Oral  PainSc:          Complications: No complications documented.

## 2021-02-25 NOTE — Progress Notes (Signed)
TCTS Evening Rounds  DOS s/p MVr Doing well Extubated No complaints Little CT output  BP (!) 168/103   Pulse 89   Temp (!) 96.8 F (36 C)   Resp 14   Ht 5\' 10"  (1.778 m)   Wt 91.2 kg   SpO2 96%   BMI 28.84 kg/m  Alert/oriented CTA RRR   Intake/Output Summary (Last 24 hours) at 02/25/2021 1737 Last data filed at 02/25/2021 1700 Gross per 24 hour  Intake 3091.66 ml  Output 2775 ml  Net 316.66 ml    A/p:  Continue early postoperative care Ricky Fitzgerald Z. Orvan Seen, [place]

## 2021-02-25 NOTE — Interval H&P Note (Signed)
History and Physical Interval Note:  02/25/2021 8:22 AM  Ricky Fitzgerald  has presented today for surgery, with the diagnosis of SEVERE MR MV PROLAPSE.  The various methods of treatment have been discussed with the patient and family. After consideration of risks, benefits and other options for treatment, the patient has consented to  Procedure(s): MINIMALLY INVASIVE MITRAL VALVE REPAIR (MVR) (Right) TRANSESOPHAGEAL ECHOCARDIOGRAM (TEE) (N/A) as a surgical intervention.  The patient's history has been reviewed, patient examined, no change in status, stable for surgery.  I have reviewed the patient's chart and labs.  Questions were answered to the patient's satisfaction.     Rexene Alberts

## 2021-02-25 NOTE — Hospital Course (Addendum)
History of Present Illness:  [name] is a 70 year-old male with history of hypertension who has been referred for surgical consultation to discuss treatment options for management of recently discovered mitral valve prolapse with severe mitral regurgitation.   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  He was noted to have a prominent systolic murmur at the time of his most recent routine follow-up physical examination with his primary care physician.  Transthoracic echocardiogram was performed demonstrating the presence of normal left ventricular function with mitral valve prolapse and at least moderate to severe mitral regurgitation.  The patient was seen in consultation by Dr. Gwenlyn Found and subsequently underwent transesophageal echocardiogram October 22, 2020 confirming the presence of mitral valve prolapse involving the P3 segment of the posterior leaflet with ruptured primary chordae tendinae causing severe mitral regurgitation.  Left ventricular function remains preserved.  The patient was referred for surgical consultation.   Patient is single and lives locally in [place].  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  He has remained physically active, exercises regularly, and has had no significant physical limitations throughout adulthood.  He enjoys cycling and skiing and he has previously always exercised on a regular basis.  He states that this past summer he noticed a tendency to have slightly decreased physical endurance and exercise capacity when riding his bicycle.  He at times feels tachypalpitations that always seem to be induced by strenuous exertion.  He otherwise has remained essentially entirely asymptomatic.  He denies exertional  shortness of breath, resting shortness of breath, PND, orthopnea, or lower extremity edema.  He has never had any significant chest pain or chest tightness.  He has essentially stopped exercising on a regular basis since he was found to have murmur on exam and severe mitral regurgitation.   The patient has made a decision to proceed with elective mitral valve repair and underwent diagnostic cardiac catheterization last month by Dr. Gwenlyn Found which revealed mild nonobstructive coronary artery disease.  At follow up visit with Dr. Roxy Manns, he reports no new problems or complaints.  He states that he did go skiing this winter and he noted a slight tendency to get short of breath more easily than a had in the past, although his symptoms of exertional shortness of breath did not limit him much at all.  He otherwise feels well and is looking forward to proceeding with elective mitral valve repair.    Hospital Course:  [name] presented to [facility] hospital on 02/25/2021.  He was taken to the operating room and underwent Minimally Invasive Mitral Valve Repair.  He tolerated the procedure without difficulty, was extubated and taken to the SICU in stable condition.  The patient did not require inotropic support.  He did develop decreased urinary output from foley catheter.  There were clots present when patient stood. The foley was irrigated without much success.  The foley catheter was repositioned and urine output improved.  He has a history of BPH and was restarted on his home Dutasteride.  He was started on coumadin for his Mitral Valve repair on POD #1.  He was started on IV Lasix for mild hypervolemia.  The patient was in a junctional bradycardia under his pacer maker.  This  persisted and he was not started on a beta blocker.  He was hypertensive and resumed on his home Micardis/HCTZ.  The patient's foley catheter was removed on POD #2. He was clinically stable and transferred to the progressive care unit on 02/27/2021.  Epicardial pacing wires were removed on 04/09. He had thrombocytopenia post op and his last platelet count was up to 143,000. Chest tubes still had a fair amount of output. Output decreased and on 04/10 were removed. Follow up CXR showed ***. Is INR was remaining 1.2 so Coumadin was increased to 5 mg. INr on 04/11 was **. Patient then went into a fib with CVR early on 04/10. He was given oral Amiodarone 200 mg bid and converted to sinus rhythm.

## 2021-02-25 NOTE — Anesthesia Procedure Notes (Signed)
Arterial Line Insertion Start/End4/04/2021 7:45 AM, 02/25/2021 7:50 AM Performed by: Clearnce Sorrel, CRNA  Patient location: Pre-op. Preanesthetic checklist: patient identified, IV checked, site marked, risks and benefits discussed, surgical consent, monitors and equipment checked, pre-op evaluation, timeout performed and anesthesia consent Lidocaine 1% used for infiltration Left, radial was placed Catheter size: 20 Fr Hand hygiene performed  and maximum sterile barriers used   Attempts: 1 Procedure performed without using ultrasound guided technique. Following insertion, dressing applied. Post procedure assessment: normal and unchanged

## 2021-02-25 NOTE — Anesthesia Procedure Notes (Signed)
Central Venous Catheter Insertion Performed by: Roberts Gaudy, MD, anesthesiologist Start/End4/04/2021 7:55 AM, 02/25/2021 8:05 AM Patient location: Pre-op. Preanesthetic checklist: patient identified, IV checked, site marked, risks and benefits discussed, surgical consent, monitors and equipment checked, pre-op evaluation, timeout performed and anesthesia consent Lidocaine 1% used for infiltration and patient sedated Hand hygiene performed  and maximum sterile barriers used  Catheter size: 8.5 Fr Sheath introducer Procedure performed using ultrasound guided technique. Ultrasound Notes:anatomy identified, needle tip was noted to be adjacent to the nerve/plexus identified, no ultrasound evidence of intravascular and/or intraneural injection and image(s) printed for medical record Attempts: 1 Following insertion, line sutured and dressing applied. Post procedure assessment: blood return through all ports, free fluid flow and no air  Patient tolerated the procedure well with no immediate complications.

## 2021-02-25 NOTE — Anesthesia Procedure Notes (Addendum)
Procedure Name: Intubation Date/Time: 02/25/2021 8:55 AM Performed by: Clearnce Sorrel, CRNA Pre-anesthesia Checklist: Patient identified, Emergency Drugs available, Suction available, Patient being monitored and Timeout performed Patient Re-evaluated:Patient Re-evaluated prior to induction Oxygen Delivery Method: Circle system utilized Preoxygenation: Pre-oxygenation with 100% oxygen Induction Type: IV induction Ventilation: Mask ventilation without difficulty and Oral airway inserted - appropriate to patient size Laryngoscope Size: Mac and 4 Grade View: Grade I Tube type: Oral Endobronchial tube: Left and 37 Fr Number of attempts: 1 Airway Equipment and Method: Stylet Placement Confirmation: ETT inserted through vocal cords under direct vision,  positive ETCO2 and breath sounds checked- equal and bilateral Tube secured with: Tape Dental Injury: Teeth and Oropharynx as per pre-operative assessment

## 2021-02-25 NOTE — Op Note (Signed)
CARDIOTHORACIC SURGERY OPERATIVE NOTE  Date of Procedure:  02/25/2021  Preoperative Diagnosis: Severe Mitral Regurgitation  Postoperative Diagnosis: Same  Procedure:    Minimally-Invasive Mitral Valve Repair  Complex valvuloplasty including artificial Gore-tex neochord placement x6  Sorin Memo 4D Ring Annuloplasty (size 36mm, catalog # [id number], serial # [id number])    Surgeon: Valentina Gu. Roxy Manns, MD  Assistant: Ellwood Handler, PA-C  Anesthesia: Roberts Gaudy, MD  Operative Findings:  Fibroelastic deficiency type myxomatous degenerative disease  Multiple ruptured primary chordae tendinae from P3 segment of posterior leaflet  Type II mitral valve dysfunction with severe mitral regurgitation  Normal left ventricular systolic function  No residual mitral regurgitation after successful valve repair                   BRIEF CLINICAL NOTE AND INDICATIONS FOR SURGERY  Patient is a 70 year old male with history of hypertension who has been referred for surgical consultation to discuss treatment options for management of recently discovered mitral valve prolapsewith severe mitral regurgitation.  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 He was noted to have a prominent systolic murmur at the time of his most recent routine follow-up physical examination with his primary care physician. Transthoracic echocardiogram wasperformed demonstrating the presence of normal left ventricular function with mitral valve prolapse and at least moderate to severe mitral regurgitation. The patient was seen in consultation by Dr. Gwenlyn Found and subsequently underwent transesophageal echocardiogram October 22, 2020 confirming the presence of mitral valve prolapse involving the P3 segment of the posterior leaflet with rupturedprimary chordae tendinaecausing severe mitral regurgitation. Left ventricular function remains preserved.  The patient was referred for surgical consultation.  The patient has been seen in consultation and counseled at length regarding the indications, risks and potential benefits of surgery.  All questions have been answered, and the patient provides full informed consent for the operation as described.    DETAILS OF THE OPERATIVE PROCEDURE  Preparation:  The patient is brought to the operating room on the above mentioned date and central monitoring was established by the anesthesia team including placement of Swan-Ganz catheter through the left internal jugular vein.  A radial arterial line is placed. The patient is placed in the supine position on the operating table.  Intravenous antibiotics are administered. General endotracheal anesthesia is induced uneventfully. The patient is initially intubated using a dual lumen endotracheal tube.  A Foley catheter is placed.  Baseline transesophageal echocardiogram was performed.  Findings were notable for myxomatous degenerative disease of the mitral valve with an obvious flail segment involving the P3 portion of the posterior leaflet.  There were multiple ruptured primary chordae tendinae.  There was severe mitral regurgitation.  There was normal left ventricular size and systolic function.  No other significant abnormalities were noted.  A soft roll is placed behind the patient's left scapula and the neck gently extended and turned to the left.   The patient's right neck, chest, abdomen, both groins, and both lower extremities are prepared and draped in a sterile manner. A time out procedure is performed.   Percutaneous Vascular Access:  Percutaneous arterial and venous access were obtained on the right [place].  Using ultrasound guidance the right common femoral vein was cannulated using the Seldinger technique a pair of Perclose vascular closure devises were placed at opposing 30 degree angles in the femoral vein, after which time an 8 French sheath  inserted.  The right common femoral  artery was cannulated using a micropuncture wire and sheath.  A pair of Perclose vascular closure devices were placed at opposing 30 degree angles in the femoral artery, and a 8 French sheath inserted.  The right internal jugular vein was cannulated  using ultrasound guidance and an 8 French sheath inserted.     Surgical Approach:  A right miniature anterolateral thoracotomy incision is performed. The incision is placed just lateral to and superior to the right nipple. The pectoralis major muscle is retracted medially and completely preserved. The right pleural space is entered through the 3rd intercostal space. A soft tissue retractor is placed.  Two 11 mm ports are placed through separate stab incisions inferiorly. The right pleural space is insufflated continuously with carbon dioxide gas through the posterior port during the remainder of the operation.  A pledgeted sutures placed through the dome of the right hemidiaphragm and retracted inferiorly to facilitate exposure.  A longitudinal incision is made in the pericardium 3 cm anterior to the phrenic nerve and silk traction sutures are placed on either Dhani of the incision for exposure.   Extracorporeal Cardiopulmonary Bypass and Myocardial Protection:   The patient was heparinized systemically.  The right common femoral vein is cannulated through the venous sheath and a guidewire advanced into the right atrium using TEE guidance.  The femoral vein cannulated using a 25 Fr long femoral venous cannula.  The right common femoral artery is cannulated through the arterial sheath and a guidewire advanced into the descending thoracic aorta using TEE guidance.  Femoral artery is cannulated with a 18 French femoral arterial cannula.  The right internal jugular vein is cannulated through the venous sheath and a guidewire advanced into the right atrium.  The internal jugular vein is cannulated using a 15 [place] pediatric  femoral venous cannula.   Adequate heparinization is verified.   The entire pre-bypass portion of the operation was notable for stable hemodynamics.  Cardiopulmonary bypass was begun.  Vacuum assist venous drainage is utilized. The incision in the pericardium is extended in both directions. Venous drainage and exposure are notably excellent. A retrograde cardioplegia cannula is placed through the right atrium into the coronary sinus using transesophageal echocardiogram guidance.  An antegrade cardioplegia cannula is placed in the ascending aorta.    The patient is cooled to 32C systemic temperature.  The aortic cross clamp is applied and cardioplegia is delivered initially in an antegrade fashion through the aortic root using modified [place] cold blood cardioplegia (Kennestone blood cardioplegia protocol).   The initial cardioplegic arrest is rapid with early diastolic arrest.  The majority of the arresting doses administered antegrade but the last 200 mL is administered retrograde through the coronary sinus catheter.  Myocardial protection was felt to be excellent.   Mitral Valve Repair:  A left atriotomy incision was performed through the interatrial groove and extended partially across the back wall of the left atrium after opening the oblique sinus inferiorly.  The mitral valve is exposed using a self-retaining retractor.  The mitral valve was inspected and notable for fibroelastic deficiency type myxomatous degenerative disease.  There are multiple ruptured primary chordae tendon any from the P3 segment of the posterior leaflet.  The remainder of both leaflets appeared essentially normal.  There was no significant leaflet fibrosis or calcification.  Artificial neochord placement was performed using Chord-X multi-strand CV-4 Goretex pre-measured loops.  The appropriate cord length (39mm) was measured from corresponding normal length primary cords from the P1 segment of the posterior  leaflet. The  papillary muscle suture of a Chord-X multi-strand suture was placed through the head of the posteromedial papillary muscle in a horizontal mattress fashion and tied over Teflon felt pledgets. Each of the three pre-measured loops were then reimplanted into the free margin of the P3 segment of the posterior leaflet.    Interrupted 2-0 Ethibond horizontal mattress sutures are placed circumferentially around the entire mitral valve annulus.  The valve was tested with saline and appeared competent even without ring annuloplasty complete. The valve was sized to a 30 mm annuloplasty ring, based upon the transverse distance between the left and right commissures and the height of the anterior leaflet, corresponding to a size just slightly larger than the overall surface area of the anterior leaflet.  A Sorin Memo 4D annuloplasty ring (size 70mm, catalog #[id number], serial [id number]) was secured in place uneventfully. All ring sutures were secured using a Cor-knot device.    The valve was tested with saline and appeared competent. There is no residual leak. There was a broad, symmetrical line of coaptation of the anterior and posterior leaflet which was confirmed using the blue ink test.  Rewarming is begun.   Procedure Completion:  The atriotomy was closed using a 2-layer closure of running 3-0 Prolene suture after placing a sump drain across the mitral valve to serve as a left ventricular vent.  One final dose of warm retrograde "reanimation dose" cardioplegia was administered retrograde through the coronary sinus catheter while all air was evacuated through the aortic root.  The aortic cross clamp was removed after a total cross clamp time of 77 minutes.  Epicardial pacing wires are fixed to the inferior wall of the right ventricule and to the right atrial appendage. The patient is rewarmed to 37C temperature. The left ventricular vent and antegrade cardioplegia cannula are removed.  The pericardial sac was drained  using a 32 French Bard drain placed through the anterior port incision. The patient is weaned and disconnected from cardiopulmonary bypass.  The patient's rhythm at separation from bypass was AV paced.  The patient was weaned from bypass without any inotropic support. Total cardiopulmonary bypass time for the operation was 123 minutes.  Followup transesophageal echocardiogram performed after separation from bypass revealed a well-seated annuloplasty ring in the mitral position with a normal functioning mitral valve. There was no residual leak.  Left ventricular function was unchanged from preoperatively.  The mean gradient across the mitral valve was estimated to be 3 mmHg.  The femoral arterial and venous cannulas were removed and all Perclose sutures secured.  Manual pressure was maintained while Protamine was administered.  The right internal jugular cannula was removed and manual pressure held on the neck and groin for 15 minutes.  Single lung ventilation was begun. The atriotomy closure was inspected for hemostasis.  The right pleural space is irrigated with saline solution and inspected for hemostasis.   A mixture of Exparel liposomal bupivacaine (20 mL) and 0.5% bupivacaine (30 mL) is utilized to create an intercostal nerve block for postoperative analgesia.  The mixture is injected under direct vision into the intercostal neurovascular bundles posteriorly to cover the second through the sixth intercostal nerve roots.  Portions of the solution are also injected into the intercostal neurovascular bundles immediately surrounding the surgical incision and immediately adjacent to the chest tube exit sites.  The right pleural space was drained using a 32 French Bard drain placed through the posterior port incision. The miniature thoracotomy incision was closed in multiple layers  in routine fashion.   The post-bypass portion of the operation was notable for stable rhythm and hemodynamics.  No blood  products were administered during the operation.   Disposition:  The patient tolerated the procedure well.  The patient was extubated in the operating room and subsequently transported to the surgical intensive care unit in stable condition. There were no intraoperative complications. All sponge instrument and needle counts are verified correct at completion of the operation.     Valentina Gu. Roxy Manns MD 02/25/2021 2:01 PM

## 2021-02-25 NOTE — OR Nursing (Signed)
Noted 120cc chest tube output before leaving OR. Text paged Dr. Roxy Manns per Dr. Linna Caprice.

## 2021-02-25 NOTE — Anesthesia Preprocedure Evaluation (Signed)
Anesthesia Evaluation  Patient identified by MRN, date of birth, ID band Patient awake    Reviewed: Allergy & Precautions, NPO status , Patient's Chart, lab work & pertinent test results  Airway Mallampati: II  TM Distance: >3 FB Neck ROM: Full    Dental  (+) Teeth Intact, Dental Advisory Given   Pulmonary    breath sounds clear to auscultation       Cardiovascular hypertension,  Rhythm:Regular Rate:Normal + Systolic murmurs    Neuro/Psych    GI/Hepatic   Endo/Other    Renal/GU      Musculoskeletal   Abdominal   Peds  Hematology   Anesthesia Other Findings   Reproductive/Obstetrics                             Anesthesia Physical Anesthesia Plan  ASA: III  Anesthesia Plan: General   Post-op Pain Management:    Induction: Intravenous  PONV Risk Score and Plan: Ondansetron and Dexamethasone  Airway Management Planned: Double Lumen EBT  Additional Equipment: Arterial line, CVP, 3D TEE and Ultrasound Guidance Line Placement  Intra-op Plan:   Post-operative Plan: Possible Post-op intubation/ventilation  Informed Consent: I have reviewed the patients History and Physical, chart, labs and discussed the procedure including the risks, benefits and alternatives for the proposed anesthesia with the patient or authorized representative who has indicated his/her understanding and acceptance.     Dental advisory given  Plan Discussed with: CRNA and Anesthesiologist  Anesthesia Plan Comments:         Anesthesia Quick Evaluation

## 2021-02-25 NOTE — Progress Notes (Signed)
Pt noted to have dark red, bloody urine with clots after dangling and standing. Spoke with Dr. Orvan Seen, MD stated probably r/t trauma with standing. Pt stated he had an enlarged prostate. Will continue to monitor per MD.

## 2021-02-25 NOTE — Brief Op Note (Signed)
02/25/2021  12:44 PM  PATIENT:  Ricky Fitzgerald  70 y.o. male  PRE-OPERATIVE DIAGNOSIS:  Severe Mitral Regurgitation Mitral Valve Prolapse  POST-OPERATIVE DIAGNOSIS:  Severe Mitral Regurgitation Mitral Valve Prolapse  PROCEDURE:  Procedure(s):  MINIMALLY INVASIVE MITRAL VALVE REPAIR -Ring Annuloplasty with a 30 mm Memo 4D Ring -Placement of Neo Chords x 3 pairs with Chord-x system  TRANSESOPHAGEAL ECHOCARDIOGRAM (TEE) (N/A)  SURGEON:  Surgeon(s) and Role:    * Rexene Alberts, MD - Primary  PHYSICIAN ASSISTANT: Erin Barrett PA-C  ANESTHESIA:   general  EBL:  168   BLOOD ADMINISTERED: CELLSAVER  DRAINS: pleural chest tubes   LOCAL MEDICATIONS USED:  NONE  SPECIMEN:  No Specimen  DISPOSITION OF SPECIMEN:  N/A  COUNTS:  YES  TOURNIQUET:  * No tourniquets in log *  DICTATION: .Dragon Dictation  PLAN OF CARE: Admit to inpatient   PATIENT DISPOSITION:  ICU - extubated and stable.   Delay start of Pharmacological VTE agent (>24hrs) due to surgical blood loss or risk of bleeding: yes

## 2021-02-26 ENCOUNTER — Encounter (HOSPITAL_COMMUNITY): Payer: Self-pay | Admitting: Thoracic Surgery (Cardiothoracic Vascular Surgery)

## 2021-02-26 ENCOUNTER — Inpatient Hospital Stay (HOSPITAL_COMMUNITY): Payer: Medicare Other

## 2021-02-26 LAB — CBC
HCT: 36.6 % — ABNORMAL LOW (ref 39.0–52.0)
HCT: 38.1 % — ABNORMAL LOW (ref 39.0–52.0)
Hemoglobin: 12.5 g/dL — ABNORMAL LOW (ref 13.0–17.0)
Hemoglobin: 12.6 g/dL — ABNORMAL LOW (ref 13.0–17.0)
MCH: 30.9 pg (ref 26.0–34.0)
MCH: 30.3 pg (ref 26.0–34.0)
MCHC: 34.2 g/dL (ref 30.0–36.0)
MCHC: 33.1 g/dL (ref 30.0–36.0)
MCV: 90.6 fL (ref 80.0–100.0)
MCV: 91.6 fL (ref 80.0–100.0)
Platelets: 177 10*3/uL (ref 150–400)
Platelets: 201 10*3/uL (ref 150–400)
RBC: 4.04 MIL/uL — ABNORMAL LOW (ref 4.22–5.81)
RBC: 4.16 MIL/uL — ABNORMAL LOW (ref 4.22–5.81)
RDW: 13.2 % (ref 11.5–15.5)
RDW: 13.5 % (ref 11.5–15.5)
WBC: 17.6 10*3/uL — ABNORMAL HIGH (ref 4.0–10.5)
WBC: 22.9 10*3/uL — ABNORMAL HIGH (ref 4.0–10.5)
nRBC: 0 % (ref 0.0–0.2)
nRBC: 0 % (ref 0.0–0.2)

## 2021-02-26 LAB — BASIC METABOLIC PANEL
Anion gap: 6 (ref 5–15)
Anion gap: 7 (ref 5–15)
BUN: 14 mg/dL (ref 8–23)
BUN: 17 mg/dL (ref 8–23)
CO2: 23 mmol/L (ref 22–32)
CO2: 26 mmol/L (ref 22–32)
Calcium: 7.7 mg/dL — ABNORMAL LOW (ref 8.9–10.3)
Calcium: 8.1 mg/dL — ABNORMAL LOW (ref 8.9–10.3)
Chloride: 106 mmol/L (ref 98–111)
Chloride: 99 mmol/L (ref 98–111)
Creatinine, Ser: 0.94 mg/dL (ref 0.61–1.24)
Creatinine, Ser: 1.19 mg/dL (ref 0.61–1.24)
GFR, Estimated: >60 mL/min (ref >60)
GFR, Estimated: >60 mL/min (ref >60)
Glucose, Bld: 160 mg/dL — ABNORMAL HIGH (ref 70–99)
Glucose, Bld: 118 mg/dL — ABNORMAL HIGH (ref 70–99)
Potassium: 4.6 mmol/L (ref 3.5–5.1)
Potassium: 4.1 mmol/L (ref 3.5–5.1)
Sodium: 135 mmol/L (ref 135–145)
Sodium: 132 mmol/L — ABNORMAL LOW (ref 135–145)

## 2021-02-26 LAB — GLUCOSE, CAPILLARY
Glucose-Capillary: 109 mg/dL — ABNORMAL HIGH (ref 70–99)
Glucose-Capillary: 114 mg/dL — ABNORMAL HIGH (ref 70–99)
Glucose-Capillary: 120 mg/dL — ABNORMAL HIGH (ref 70–99)
Glucose-Capillary: 121 mg/dL — ABNORMAL HIGH (ref 70–99)
Glucose-Capillary: 122 mg/dL — ABNORMAL HIGH (ref 70–99)
Glucose-Capillary: 123 mg/dL — ABNORMAL HIGH (ref 70–99)
Glucose-Capillary: 126 mg/dL — ABNORMAL HIGH (ref 70–99)
Glucose-Capillary: 128 mg/dL — ABNORMAL HIGH (ref 70–99)
Glucose-Capillary: 129 mg/dL — ABNORMAL HIGH (ref 70–99)

## 2021-02-26 LAB — MAGNESIUM
Magnesium: 2.4 mg/dL (ref 1.7–2.4)
Magnesium: 2.3 mg/dL (ref 1.7–2.4)

## 2021-02-26 MED ORDER — IRBESARTAN 300 MG PO TABS
300.0000 mg | ORAL_TABLET | Freq: Every day | ORAL | Status: DC
  Administered 2021-02-27 – 2021-03-02 (×4): 300 mg via ORAL
  Filled 2021-02-26 (×4): qty 1

## 2021-02-26 MED ORDER — FUROSEMIDE 10 MG/ML IJ SOLN
20.0000 mg | Freq: Four times a day (QID) | INTRAMUSCULAR | Status: DC
  Administered 2021-02-26 (×2): 20 mg via INTRAVENOUS
  Filled 2021-02-26 (×2): qty 2

## 2021-02-26 MED ORDER — FUROSEMIDE 10 MG/ML IJ SOLN
40.0000 mg | Freq: Two times a day (BID) | INTRAMUSCULAR | Status: AC
  Administered 2021-02-26 – 2021-02-28 (×5): 40 mg via INTRAVENOUS
  Filled 2021-02-26 (×6): qty 4

## 2021-02-26 MED ORDER — WARFARIN - PHYSICIAN DOSING INPATIENT: Freq: Every day | Status: DC

## 2021-02-26 MED ORDER — INSULIN ASPART 100 UNIT/ML ~~LOC~~ SOLN
0.0000 [IU] | SUBCUTANEOUS | Status: DC
  Administered 2021-02-26 (×3): 2 [IU] via SUBCUTANEOUS

## 2021-02-26 MED ORDER — ORAL CARE MOUTH RINSE
15.0000 mL | Freq: Two times a day (BID) | OROMUCOSAL | Status: DC
  Administered 2021-02-26 – 2021-03-01 (×5): 15 mL via OROMUCOSAL

## 2021-02-26 MED ORDER — ENOXAPARIN SODIUM 40 MG/0.4ML ~~LOC~~ SOLN
40.0000 mg | Freq: Every day | SUBCUTANEOUS | Status: DC
  Administered 2021-02-27 – 2021-03-01 (×3): 40 mg via SUBCUTANEOUS
  Filled 2021-02-26 (×3): qty 0.4

## 2021-02-26 MED ORDER — KETOROLAC TROMETHAMINE 15 MG/ML IJ SOLN
15.0000 mg | Freq: Four times a day (QID) | INTRAMUSCULAR | Status: AC
  Administered 2021-02-26 – 2021-02-27 (×5): 15 mg via INTRAVENOUS
  Filled 2021-02-26 (×5): qty 1

## 2021-02-26 MED ORDER — INSULIN ASPART 100 UNIT/ML ~~LOC~~ SOLN: 0.0000 [IU] | SUBCUTANEOUS | Status: DC

## 2021-02-26 MED ORDER — HYDROCHLOROTHIAZIDE 12.5 MG PO CAPS
12.5000 mg | ORAL_CAPSULE | Freq: Every day | ORAL | Status: DC
  Administered 2021-02-27 – 2021-03-02 (×4): 12.5 mg via ORAL
  Filled 2021-02-26 (×4): qty 1

## 2021-02-26 MED ORDER — WARFARIN SODIUM 2.5 MG PO TABS
2.5000 mg | ORAL_TABLET | Freq: Every day | ORAL | Status: DC
  Administered 2021-02-26 – 2021-02-27 (×2): 2.5 mg via ORAL
  Filled 2021-02-26 (×2): qty 1

## 2021-02-26 MED ORDER — TELMISARTAN-HCTZ 80-12.5 MG PO TABS: 1.0000 | ORAL_TABLET | Freq: Every day | ORAL | Status: DC

## 2021-02-26 NOTE — Anesthesia Postprocedure Evaluation (Signed)
Anesthesia Post Note  Patient: Ricky Fitzgerald  Procedure(s) Performed: MINIMALLY INVASIVE MITRAL VALVE REPAIR (MVR) USING MEMO 4D 30MM RING (Right Chest) TRANSESOPHAGEAL ECHOCARDIOGRAM (TEE) (N/A )     Patient location during evaluation: SICU Anesthesia Type: General Level of consciousness: awake and alert Pain management: pain level controlled Vital Signs Assessment: post-procedure vital signs reviewed and stable Respiratory status: spontaneous breathing, nonlabored ventilation, respiratory function stable and patient connected to nasal cannula oxygen Cardiovascular status: stable Postop Assessment: no apparent nausea or vomiting Anesthetic complications: no   No complications documented.  Last Vitals:  Vitals:   02/26/21 1245 02/26/21 1300  BP: 119/78 138/88  Pulse: 70 70  Resp: 15 17  Temp:    SpO2: 93% 95%    Last Pain:  Vitals:   02/26/21 1300  TempSrc:   PainSc: 9                  Sebrina Kessner COKER

## 2021-02-26 NOTE — Progress Notes (Signed)
Anesthesiology Follow-up:  Awake and alert. In good spirits, having some right sided discomfort, otherwise minimal pain.  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 Extubated in OR, Stable post-op course.  Ricky Fitzgerald

## 2021-02-26 NOTE — Progress Notes (Addendum)
[facility] DAILY ICU PROGRESS NOTE                   [address]            [place] [address]          [phone]   1 Day Post-Op Procedure(s) (LRB): MINIMALLY INVASIVE MITRAL VALVE REPAIR (MVR) USING MEMO 4D 30MM RING (Right) TRANSESOPHAGEAL ECHOCARDIOGRAM (TEE) (N/A)  Total Length of Stay:  LOS: 1 day   Subjective:  Patient states feels okay considering.  Foley issues overnight noted.  Patient requests his dutasteride restarted for his history of BPH.  Denies N/V this morning.  Objective: Vital signs in last 24 hours: Temp:  [82.4 F (28 C)-99.7 F (37.6 C)] 97.9 F (36.6 C) (04/07 0700) Pulse Rate:  [70-91] 70 (04/07 0800) Cardiac Rhythm: Atrial paced (04/07 0400) Resp:  [12-25] 21 (04/07 0800) BP: (117-150)/(82-100) 117/85 (04/07 0800) SpO2:  [91 %-100 %] 96 % (04/07 0800) Arterial Line BP: (114-159)/(59-90) 121/61 (04/07 0800) Weight:  [97.2 kg] 97.2 kg (04/07 0600)  Filed Weights   02/25/21 0658 02/26/21 0600  Weight: 91.2 kg 97.2 kg    Weight change: 6.027 kg   Hemodynamic parameters for last 24 hours: CVP:  [0 mmHg-12 mmHg] 11 mmHg  Intake/Output from previous day: 04/06 0701 - 04/07 0700 In: 4734.4 [I.V.:3209.9; Blood:391; IV Piggyback:983.5] Out: 2979 [Urine:3675; Chest Tube:710]  Current Meds: Scheduled Meds: . acetaminophen  1,000 mg Oral Q6H  . [START ON 02/27/2021] aspirin  81 mg Oral Once  . aspirin EC  325 mg Oral Daily  . bisacodyl  10 mg Oral Daily   Or  . bisacodyl  10 mg Rectal Daily  . chlorhexidine  15 mL Mouth Rinse BID  . Chlorhexidine Gluconate Cloth  6 each Topical Daily  . docusate sodium  200 mg Oral Daily  . dutasteride  0.5 mg Oral Daily  . [START ON 02/27/2021] enoxaparin (LOVENOX) injection  40 mg Subcutaneous QHS  . furosemide  20 mg Intravenous Q6H  . insulin aspart  0-24 Units Subcutaneous Q4H  . mouth rinse  15 mL Mouth Rinse q12n4p  . [START ON 02/27/2021] pantoprazole  40 mg Oral Daily  . sodium chloride  flush  3 mL Intravenous Q12H  . warfarin  2.5 mg Oral q1600  . Warfarin - Physician Dosing Inpatient   Does not apply q1600   Continuous Infusions: . sodium chloride    . cefUROXime (ZINACEF)  IV Stopped (02/25/21 2136)  . lactated ringers    . lactated ringers    . nitroGLYCERIN Stopped (02/26/21 0549)   PRN Meds:.labetalol, metoprolol tartrate, morphine injection, ondansetron (ZOFRAN) IV, oxyCODONE, sodium chloride flush, traMADol  General appearance: alert, cooperative and no distress Heart: regular rate and rhythm Lungs: clear to auscultation bilaterally Abdomen: soft, non-tender; bowel sounds normal; no masses,  no organomegaly Extremities: edema none appreciated Wound: aquacel in place  Lab Results: CBC: Recent Labs    02/25/21 1953 02/25/21 2144 02/26/21 0307  WBC 21.0*  --  17.6*  HGB 13.9 11.9* 12.5*  HCT 40.0 35.0* 36.6*  PLT 180  --  177   BMET:  Recent Labs    02/25/21 1953 02/25/21 2144 02/26/21 0307  NA 137 139 135  K 3.2* 4.7 4.6  CL 109  --  106  CO2 22  --  23  GLUCOSE 137*  --  160*  BUN 10  --  14  CREATININE 0.64  --  0.94  CALCIUM 6.4*  --  7.7*    CMET: Lab Results  Component Value Date   WBC 17.6 (H) 02/26/2021   HGB 12.5 (L) 02/26/2021   HCT 36.6 (L) 02/26/2021   PLT 177 02/26/2021   GLUCOSE 160 (H) 02/26/2021   CHOL 181 12/13/2012   TRIG 76.0 12/13/2012   HDL 44.10 12/13/2012   LDLDIRECT 146.9 11/21/2007   LDLCALC 122 (H) 12/13/2012   ALT 29 02/23/2021   AST 37 02/23/2021   NA 135 02/26/2021   K 4.6 02/26/2021   CL 106 02/26/2021   CREATININE 0.94 02/26/2021   BUN 14 02/26/2021   CO2 23 02/26/2021   TSH 1.14 12/13/2012   PSA 7.12 (H) 12/13/2012   INR 1.4 (H) 02/25/2021   HGBA1C 5.8 (H) 02/23/2021      PT/INR:  Recent Labs    02/25/21 1458  LABPROT 16.2*  INR 1.4*   Radiology: [facility] Chest Port 1 View  Result Date: 02/26/2021 CLINICAL DATA:  Chest tube.  Open-heart surgery. EXAM: PORTABLE CHEST 1 VIEW COMPARISON:   02/25/2021.  02/23/2021. FINDINGS: Left IJ line in stable position. Two right chest tubes in stable position. Previously described mediastinal widening is unchanged. Again mediastinal process cannot be completely excluded. Low lung volumes with persistent bibasilar atelectasis. Tiny right pleural effusion cannot be excluded. No pneumothorax gastric distention. IMPRESSION: 1. Left IJ line stable position. Two right chest tubes in stable position. No pneumothorax. 2. Previously described mediastinal widening is unchanged. Again a mediastinal process cannot be completely excluded. 3. Low lung volumes with persistent bibasilar atelectasis. Tiny right pleural effusion cannot be excluded. 4.  Gastric distention. Electronically Signed   By: Marcello Moores  Register   On: 02/26/2021 06:43   DG Chest Port 1 View  Addendum Date: 02/25/2021   ADDENDUM REPORT: 02/25/2021 15:31 ADDENDUM: Findings conveyed toCLARENCE Choua Chalker on 02/25/2021  at15:31. Electronically Signed   By: Suzy Bouchard M.D.   On: 02/25/2021 15:31   Result Date: 02/25/2021 CLINICAL DATA:  Reason for exam: Atelectasis S/P MINIMALLY INVASIVE MITRAL VALVE REPAIR (MVR) USING MEMO 4D 30MM RING EXAM: PORTABLE CHEST 1 VIEW COMPARISON:  Chest radiograph 02/23/2021 FINDINGS: Increased prominence of the upper mediastinum compared to radiograph 02/23/2021. Patient semi-upright however cannot exclude expansion of the aorta or hematoma in the mediastinum. Upper mediastinum measures 13.5 cm compared to 10.1 cm on comparison upright exam. Chest tube in the RIGHT hemithorax.  No pneumothorax. IMPRESSION: 1. Increased width of the upper mediastinum. While this may be in part do to semi upright positioning, cannot exclude hematoma in the upper mediastinum or abnormal aortic contour. Consider CT of the thorax with contrast for further evaluation. 2. Two RIGHT chest tubes in place without pneumothorax. Electronically Signed: By: Suzy Bouchard M.D. On: 02/25/2021 15:19      Assessment/Plan: S/P Procedure(s) (LRB): 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 CV- junctional bradycardia under pacemaker- no BB for now, continue pacing.. coumadin started by Dr. Roxy Manns for MV Repair 2. Pulm- no pneumothorax, 710 cc output since surgery, leave chest tubes in place today aggressive IS 3. Renal- creatinine WNL, K ok, weight is elevated 14 lbs above admission, IV lasix ordered by Dr. Roxy Manns 4. GU- H/O BPH, foley issues, noted, patients dutasteride ordered 5. Expected post operative blood loss anemia, minimal hgb at 12.5 6. CBGs- controlled, will continue SSIP today 7. Dispo- patient overall doing well, POD #1, junctional bradycardia under pacer, hold BB, gentle diuresis, coumadin  for MV Repair, progression orders per Dr. Geraldo Docker, PA-C 02/26/2021 8:18 AM    I have seen and examined the patient and agree with the assessment and plan as outlined.  Doing well POD1.  Continue AAI pacing.  Mobilize.  Diuresis.  D/C lines.  Start Coumadin.  Rexene Alberts, MD 02/26/2021 8:33 AM

## 2021-02-26 NOTE — Progress Notes (Signed)
EVENING ROUNDS NOTE :     [address]       [place] [address]             [phone]                 1 Day Post-Op Procedure(s) (LRB): MINIMALLY INVASIVE MITRAL VALVE REPAIR (MVR) USING MEMO 4D 30MM RING (Right) TRANSESOPHAGEAL ECHOCARDIOGRAM (TEE) (N/A)   Total Length of Stay:  LOS: 1 day  Events:   No events Remains paced.  Off nitro    BP (!) 152/98   Pulse 71   Temp 97.9 F (36.6 C) (Oral)   Resp 19   Ht 5\' 10"  (1.778 m)   Wt 97.2 kg   SpO2 96%   BMI 30.75 kg/m   CVP:  [3 mmHg-12 mmHg] 11 mmHg     . sodium chloride    . cefUROXime (ZINACEF)  IV Stopped (02/26/21 1045)  . lactated ringers    . lactated ringers 20 mL/hr at 02/26/21 1640  . nitroGLYCERIN Stopped (02/26/21 1432)    I/O last 3 completed shifts: In: 4734.4 [I.V.:3209.9; Blood:391; Other:150; IV Piggyback:983.5] Out: 0263 [Urine:3675; Chest Tube:710]   CBC Latest Ref Rng & Units 02/26/2021 02/26/2021 02/25/2021  WBC 4.0 - 10.5 K/uL 22.9(H) 17.6(H) -  Hemoglobin 13.0 - 17.0 g/dL 12.6(L) 12.5(L) 11.9(L)  Hematocrit 39.0 - 52.0 % 38.1(L) 36.6(L) 35.0(L)  Platelets 150 - 400 K/uL 201 177 -    BMP Latest Ref Rng & Units 02/26/2021 02/26/2021 02/25/2021  Glucose 70 - 99 mg/dL 118(H) 160(H) -  BUN 8 - 23 mg/dL 17 14 -  Creatinine 0.61 - 1.24 mg/dL 1.19 0.94 -  BUN/Creat Ratio 10 - 24 - - -  Sodium 135 - 145 mmol/L 132(L) 135 139  Potassium 3.5 - 5.1 mmol/L 4.1 4.6 4.7  Chloride 98 - 111 mmol/L 99 106 -  CO2 22 - 32 mmol/L 26 23 -  Calcium 8.9 - 10.3 mg/dL 8.1(L) 7.7(L) -    ABG    Component Value Date/Time   PHART 7.382 02/25/2021 2144   PCO2ART 40.2 02/25/2021 2144   PO2ART 75 (L) 02/25/2021 2144   HCO3 23.8 02/25/2021 2144   TCO2 25 02/25/2021 2144   ACIDBASEDEF 1.0 02/25/2021 2144   O2SAT 94.0 02/25/2021 2144       Melodie Bouillon, MD 02/26/2021 5:20 PM

## 2021-02-27 ENCOUNTER — Inpatient Hospital Stay (HOSPITAL_COMMUNITY): Payer: Medicare Other

## 2021-02-27 LAB — BASIC METABOLIC PANEL
Anion gap: 7 (ref 5–15)
BUN: 17 mg/dL (ref 8–23)
CO2: 28 mmol/L (ref 22–32)
Calcium: 8.4 mg/dL — ABNORMAL LOW (ref 8.9–10.3)
Chloride: 99 mmol/L (ref 98–111)
Creatinine, Ser: 0.92 mg/dL (ref 0.61–1.24)
GFR, Estimated: >60 mL/min (ref >60)
Glucose, Bld: 121 mg/dL — ABNORMAL HIGH (ref 70–99)
Potassium: 4 mmol/L (ref 3.5–5.1)
Sodium: 134 mmol/L — ABNORMAL LOW (ref 135–145)

## 2021-02-27 LAB — GLUCOSE, CAPILLARY
Glucose-Capillary: 103 mg/dL — ABNORMAL HIGH (ref 70–99)
Glucose-Capillary: 111 mg/dL — ABNORMAL HIGH (ref 70–99)
Glucose-Capillary: 90 mg/dL (ref 70–99)

## 2021-02-27 LAB — CBC
HCT: 34.1 % — ABNORMAL LOW (ref 39.0–52.0)
Hemoglobin: 11.3 g/dL — ABNORMAL LOW (ref 13.0–17.0)
MCH: 30.3 pg (ref 26.0–34.0)
MCHC: 33.1 g/dL (ref 30.0–36.0)
MCV: 91.4 fL (ref 80.0–100.0)
Platelets: 133 10*3/uL — ABNORMAL LOW (ref 150–400)
RBC: 3.73 MIL/uL — ABNORMAL LOW (ref 4.22–5.81)
RDW: 13.3 % (ref 11.5–15.5)
WBC: 16.1 10*3/uL — ABNORMAL HIGH (ref 4.0–10.5)
nRBC: 0 % (ref 0.0–0.2)

## 2021-02-27 LAB — PROTIME-INR
INR: 1.2 (ref 0.8–1.2)
Prothrombin Time: 14.3 seconds (ref 11.4–15.2)

## 2021-02-27 MED ORDER — HYDRALAZINE HCL 25 MG PO TABS: 25.0000 mg | ORAL_TABLET | Freq: Four times a day (QID) | ORAL | Status: DC | PRN

## 2021-02-27 MED ORDER — ~~LOC~~ CARDIAC SURGERY, PATIENT & FAMILY EDUCATION: Freq: Once | Status: AC

## 2021-02-27 MED FILL — Electrolyte-R (PH 7.4) Solution: INTRAVENOUS | Qty: 5000 | Status: AC

## 2021-02-27 MED FILL — Sodium Bicarbonate IV Soln 8.4%: INTRAVENOUS | Qty: 50 | Status: AC

## 2021-02-27 MED FILL — Sodium Chloride IV Soln 0.9%: INTRAVENOUS | Qty: 3000 | Status: AC

## 2021-02-27 MED FILL — Heparin Sodium (Porcine) Inj 1000 Unit/ML: INTRAMUSCULAR | Qty: 10 | Status: AC

## 2021-02-27 NOTE — Discharge Instructions (Signed)
Discharge Instructions:  1. You may shower, please wash incisions daily with soap and water and keep dry.  If you wish to cover wounds with dressing you may do so but please keep clean and change daily.  No tub baths or swimming until incisions have completely healed.  If your incisions become red or develop any drainage please call our office at [phone]  2. No Driving until cleared by Dr. Guy Sandifer office and you are no longer using narcotic pain medications  3. Monitor your weight daily.. Please use the same scale and weigh at same time... If you gain 5-10 lbs in 48 hours with associated lower extremity swelling, please contact our office at [phone]  4. Fever of 101.5 for at least 24 hours with no source, please contact our office at [phone]  5. Activity- up as tolerated, please walk at least 3 times per day.  Avoid strenuous activity, no lifting, pushing, or pulling with affected Josue for several weeks   6. If any questions or concerns arise, please do not hesitate to contact our office at [phone]    Warfarin Information Warfarin is a blood thinner (anticoagulant). Anticoagulants help to prevent the formation of blood clots. They also help to stop the growth of blood clots. Your health care provider will monitor the anticoagulation effect of warfarin closely and will adjust your medicine as needed. Who should use warfarin? Warfarin is prescribed for people who have blood clots, or who are at risk for developing harmful blood clots, such as people who:  Have mechanical heart valves.  Have irregular heart rhythms (atrial fibrillation).  Have certain clotting disorders.  Have had blood clots in the past or are currently receiving treatment for them. This includes people who have had a stroke, blood clots in the lungs (pulmonary embolism, or PE), or blood clots in the legs (deep vein thrombosis, or DVT). How is warfarin taken? Warfarin is taken by mouth (orally). Warfarin  tablets come in different strengths. The strength is printed on the tablet and each strength is a different color. If you get a new prescription filled and the color of your tablet is different than usual, tell your pharmacist or health care provider immediately.  Take warfarin exactly as told by your health care provider. Doing this helps you avoid bleeding or blood clots that could result in serious injury, pain, or disability.  Take your medicine at the same time every day. If you forget to take your dose of warfarin, take it as soon as you remember that day. If you do not remember on that day, do not take an extra dose the next day.  Contact your health care provider if you miss or take an extra dose. Do not change your dosage on your own to make up for missed or extra doses. What blood tests do I need while taking warfarin? Warfarin is a medicine that needs to be closely monitored. It is very important to keep all lab visits and follow-up visits with your health care provider. While taking warfarin, you will need to have blood tests regularly to measure your blood clotting time. These tests are called prothrombin tests (PT)or international normalized ratio (INR) tests. These tests can be done with a finger stick or a blood draw. What does the INR test result mean? The PT test results will be reported as the INR. The INR tells your health care provider whether your dosage of warfarin needs to be changed. The longer it takes your blood to  clot, the higher the INR. Your health care provider will tell you your target INR range. If your INR is not in your target range, your health care provider may adjust your dosage.  If your INR is above your target range, there is a risk of bleeding. Your dosage of warfarin may need to be decreased.  If your INR is below your target range, there is a risk of clotting. Your dosage of warfarin may need to be increased. How often is the INR test needed?  When you  first start warfarin, you will usually have your INR checked every few days.  You may need to have INR tests done more than once a week until the health care provider determines the correct dosage of warfarin.  After you have reached your target INR, your INR will be tested less often. However, you will need to have your INR checked at least once every 4-6 weeks for the entire time you are taking warfarin.  Some people may qualify for a home monitoring program to check their INR. Ask your health care provider if you qualify for this program.   What are the Brantly effects of warfarin? Too much warfarin can cause bleeding or hemorrhage in any part of the body, such as:  Bleeding from the gums.  Unexplained bruises or bruises that get larger.  Blood in the urine or stools (feces) that are bloody or dark in color.  Bleeding in the brain (hemorrhagic stroke).  A nosebleed that is not easily stopped.  Coughing up or vomiting blood. Warfarin use may also cause:  Skin rash or irritations.  Nausea that does not go away.  Severe pain in the back or joints.  Painful toes that turn blue or purple (purple toe syndrome).  Painful ulcers that do not go away (skin necrosis). What precautions do I need to take while using warfarin?  Wear or carry identification that says that you are taking warfarin.  Make sure that all health care providers, including your dentist, know you are taking warfarin.  If you need surgery, talk with your health care provider about whether you should stop taking warfarin before your surgery.  Avoid situations that cause bleeding. You may bleed more easily while taking warfarin. To limit bleeding, take the following actions: ? Use a softer toothbrush. ? Floss with waxed floss, not unwaxed floss. ? Shave with an electric razor, not with a blade. ? Limit your use of sharp objects. ? Avoid activities that put you at risk for injury, such as contact sports. What do I  need to know about warfarin and pregnancy or breastfeeding?  Warfarin is not recommended during the first trimester of pregnancy due to an increased risk of birth defects. In certain situations, a woman may take warfarin after her first trimester of pregnancy.  If you are taking warfarin and you become pregnant or plan to become pregnant, contact your health care provider right away.  If you plan to breastfeed while taking warfarin, talk with your health care provider first. What do I need to know about warfarin and alcohol or drug use?  Avoid drinking alcohol, or limit alcohol intake to no more than 1 drink a day for nonpregnant women and 2 drinks a day for men. ? Be aware of how much alcohol is in your drink. In the [place] one drink equals one 12 oz bottle of beer (355 mL), one 5 oz glass of wine (148 mL), or one 1 oz glass of hard  liquor (44 mL). ? If you change the amount of alcohol that you drink, tell your health care provider. Your warfarin dosage may need to be changed.  Avoid tobacco products, such as cigarettes, e-cigarettes, and chewing tobacco. If you need help quitting, ask your health care provider. ? If you change the amount of nicotine or tobacco that you use, tell your health care provider. Your warfarin dosage may need to be changed.  Avoid street drugs while taking warfarin. The effects of street drugs on warfarin are not known. What do I need to know about warfarin and other medicines or supplements?  Many prescription and over-the-counter medicines can interfere with warfarin. Talk with your health care provider or your pharmacist before starting or stopping any new medicines. This includes over-the-counter vitamins, dietary supplements, herbal medicines, and pain medicines. Your warfarin dosage may need to be adjusted.  Some common over-the-counter medicines that may increase the risk of bleeding while taking warfarin include: ? Aspirin. ? NSAIDs, such as ibuprofen or  naproxen. ? Vitamin E. ? Fish oils. What do I need to know about warfarin and my diet?  It is important to maintain a normal, balanced diet while taking warfarin. Avoid major changes in your diet. If you are going to change your diet, talk with your health care provider before making changes.  Your health care provider may recommend that you work with a dietitian.  Vitamin K decreases the effect of warfarin, and it is found in many foods. Eat a consistent amount of foods that contain vitamin K. For example, you may decide to eat 2 vitamin K-containing foods each day. Contact a health care provider if:  You miss a dose.  You take an extra dose.  You plan to have any kind of surgery or procedure.  You are unable to take your medicine due to nausea, vomiting, or diarrhea.  You have any major changes in your diet or you plan to make any major changes in your diet.  You start or stop any over-the-counter medicine, prescription medicine, herbal supplement, or dietary supplement.  You become pregnant, plan to become pregnant, or think you may be pregnant.  You have menstrual periods that are heavier than usual.  You have unusual bruising. Get help right away if you:  Have signs of an allergic reaction, such as: ? Swelling of the lips, face, tongue, mouth, or throat. ? Rash or itchy, red, swollen areas of skin (hives). ? Trouble breathing. ? Chest tightness.  Fall or have an accident, especially if you hit your head.  Have signs that your blood is too thin, such as: ? Blood in your urine. Your urine may look reddish, pinkish, or tea-colored. ? Blood in your stool. Your stool may be black or bright red. ? You vomit blood or cough up blood. The blood may be bright red, or it may look like coffee grounds. ? Bleeding that does not stop after applying pressure to the area for 30 minutes.  Have signs of a blood clot in your leg or arm, such as: ? Pain or swelling in your leg or  arm. ? Skin that is red or warm to the touch on your arm or leg.  Have signs of blood in your lung, such as: ? Shortness of breath or difficulty breathing. ? Chest pain. ? Unexplained fever.  Have any symptoms of a stroke. "BE FAST" is an easy way to remember the main warning signs of a stroke: ? B - Balance. Signs  are dizziness, sudden trouble walking, or loss of balance. ? E - Eyes. Signs are trouble seeing or a sudden change in vision. ? F - Face. Signs are sudden weakness or numbness of the face, or the face or eyelid drooping on one Ricky Fitzgerald. ? A - Arms. Signs are weakness or numbness in an arm. This happens suddenly and usually on one Ricky Fitzgerald of the body. ? S - Speech. Signs are sudden trouble speaking, slurred speech, or trouble understanding what people say. ? T - Time. Time to call emergency services. Write down what time symptoms started.  Have other signs of a stroke, such as: ? A sudden, severe headache with no known cause. ? Nausea or vomiting. ? Seizure.  Have other signs of a reaction to warfarin, such as: ? Purple or blue toes. ? Skin ulcers that do not go away. These symptoms may represent a serious problem that is an emergency. Do not wait to see if the symptoms will go away. Get medical help right away. Call your local emergency services (911 in the [place] Do not drive yourself to the hospital. Summary  Warfarin is a medicine that thins blood. It is used to prevent or treat blood clots. You must be monitored closely while on this medicine. Keep all follow-up visits.  Make sure that you know your target INR range and your warfarin dosage.  Wear or carry identification that says that you are taking warfarin.  Take warfarin at the same time every day. Call your health care provider if you miss a dose or if you take an extra dose. Do not change the dosage of warfarin on your own.  Know the signs and symptoms of blood clots, bleeding, and a stroke. Know when to get emergency  medical help. This information is not intended to replace advice given to you by your health care provider. Make sure you discuss any questions you have with your health care provider. Document Revised: 07/21/2020 Document Reviewed: 09/07/2019 Elsevier Patient Education  [place].

## 2021-02-27 NOTE — Progress Notes (Signed)
Pt transferred to 4 east, CHG completed, tele initiated.  No complaints of pain at this time, bed in lowest locked position and call bell within reach.

## 2021-02-27 NOTE — Plan of Care (Signed)
  Problem: Education: Goal: Will demonstrate proper wound care and an understanding of methods to prevent future damage Outcome: Progressing Goal: Knowledge of disease or condition will improve Outcome: Progressing   Problem: Activity: Goal: Risk for activity intolerance will decrease Outcome: Progressing   Problem: Cardiac: Goal: Will achieve and/or maintain hemodynamic stability Outcome: Progressing   Problem: Respiratory: Goal: Respiratory status will improve Outcome: Progressing   Problem: Urinary Elimination: Goal: Ability to achieve and maintain adequate renal perfusion and functioning will improve Outcome: Progressing

## 2021-02-27 NOTE — Progress Notes (Addendum)
      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  Doing well, no complaints   Objective: Vital signs in last 24 hours: Temp:  [96.6 F (35.9 C)-98.4 F (36.9 C)] 96.6 F (35.9 C) (04/08 0700) Pulse Rate:  [67-71] 70 (04/08 0600) Cardiac Rhythm: Atrial paced (04/08 0000) Resp:  [11-28] 20 (04/08 0600) BP: (110-166)/(77-120) 150/96 (04/08 0600) SpO2:  [89 %-98 %] 95 % (04/08 0600) Weight:  [96.6 kg] 96.6 kg (04/08 0515)  Intake/Output from previous day: 04/07 0701 - 04/08 0700 In: 488.1 [I.V.:297.7; IV Piggyback:190.4] Out: 3520 [Urine:3050; Chest Tube:470]  General appearance: alert, cooperative and no distress Heart: regular rate and rhythm Lungs: clear to auscultation bilaterally Abdomen: soft, non-tender; bowel sounds normal; no masses,  no organomegaly Extremities: edema trace Wound: clean and dry  Lab Results: Recent Labs    02/26/21 1522 02/27/21 0508  WBC 22.9* 16.1*  HGB 12.6* 11.3*  HCT 38.1* 34.1*  PLT 201 133*   BMET:  Recent Labs    02/26/21 1522 02/27/21 0508  NA 132* 134*  K 4.1 4.0  CL 99 99  CO2 26 28  GLUCOSE 118* 121*  BUN 17 17  CREATININE 1.19 0.92  CALCIUM 8.1* 8.4*    PT/INR:  Recent Labs    02/27/21 0508  LABPROT 14.3  INR 1.2   ABG    Component Value Date/Time   PHART 7.382 02/25/2021 2144   HCO3 23.8 02/25/2021 2144   TCO2 25 02/25/2021 2144   ACIDBASEDEF 1.0 02/25/2021 2144   O2SAT 94.0 02/25/2021 2144   CBG (last 3)  Recent Labs    02/27/21 0003 02/27/21 0356 02/27/21 0702  GLUCAP 90 111* 103*    Assessment/Plan: S/P Procedure(s) (LRB): 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 CV- junctional  bradycardia under pacer, rates in the 30s- continue to hold BB, + HTN, home antihypertensive agent restarted, continue to hold BB 2. INR 1.2, continue coumadin at 2.5 mg daily 3. Pulm- no acute issues, wean oxygen, CTs had large output when up ambulating leave in place today 4. Renal- creatinine WNL, weight came down after IV Lasix today, dose increased for today, K at 4.0 5. Expected post operative blood loss anemia- Hgb stable 6. GU- d/c foley, BPH meds restarted 7. CBGs controlled, patient not a diabetic, will d/c SSIP 8. Dispo- patient stable, rhythm remains junctional, continue coumadin, diuretics increased, chest tubes to remain in place, ?transfer to 4E?   LOS: 2 days    Ellwood Handler, PA-C  02/27/2021   I have seen and examined the patient and agree with the assessment and plan as outlined.  Doing well although still bradycardic under pacer.  Continue AAI pacing.  Mobilize.  Diuresis.  Restart Micardis/HCT for hypertension.  Transfer 4E  Rexene Alberts, MD 02/27/2021 8:32 AM

## 2021-02-27 NOTE — Progress Notes (Signed)
Mobility Specialist - Progress Note   02/27/21 1442  Mobility  Activity Ambulated in hall  Level of Assistance Standby assist, set-up cues, supervision of patient - no hands on  Assistive Device Front wheel walker  Distance Ambulated (ft) 470 ft  Mobility Response Tolerated well  Mobility performed by Mobility specialist  $Mobility charge 1 Mobility   Pre-mobility: 68 HR, 97% SpO2 Post-mobility: 70 HR, 96% SpO2  Pt asx throughout ambulation. Pt sitting up on edge of bed after walk.   Pricilla Handler Mobility Specialist Mobility Specialist Phone: [phone]

## 2021-02-28 LAB — BASIC METABOLIC PANEL
Anion gap: 7 (ref 5–15)
BUN: 16 mg/dL (ref 8–23)
CO2: 28 mmol/L (ref 22–32)
Calcium: 8.2 mg/dL — ABNORMAL LOW (ref 8.9–10.3)
Chloride: 100 mmol/L (ref 98–111)
Creatinine, Ser: 1.05 mg/dL (ref 0.61–1.24)
GFR, Estimated: >60 mL/min (ref >60)
Glucose, Bld: 107 mg/dL — ABNORMAL HIGH (ref 70–99)
Potassium: 3.5 mmol/L (ref 3.5–5.1)
Sodium: 135 mmol/L (ref 135–145)

## 2021-02-28 LAB — CBC
HCT: 32.9 % — ABNORMAL LOW (ref 39.0–52.0)
Hemoglobin: 11 g/dL — ABNORMAL LOW (ref 13.0–17.0)
MCH: 30.3 pg (ref 26.0–34.0)
MCHC: 33.4 g/dL (ref 30.0–36.0)
MCV: 90.6 fL (ref 80.0–100.0)
Platelets: 143 10*3/uL — ABNORMAL LOW (ref 150–400)
RBC: 3.63 MIL/uL — ABNORMAL LOW (ref 4.22–5.81)
RDW: 13.2 % (ref 11.5–15.5)
WBC: 13.9 10*3/uL — ABNORMAL HIGH (ref 4.0–10.5)
nRBC: 0 % (ref 0.0–0.2)

## 2021-02-28 LAB — MAGNESIUM: Magnesium: 2 mg/dL (ref 1.7–2.4)

## 2021-02-28 LAB — PROTIME-INR
INR: 1.2 (ref 0.8–1.2)
Prothrombin Time: 14.4 seconds (ref 11.4–15.2)

## 2021-02-28 MED ORDER — LACTULOSE 10 GM/15ML PO SOLN
20.0000 g | Freq: Once | ORAL | Status: AC
  Administered 2021-02-28: 20 g via ORAL
  Filled 2021-02-28: qty 30

## 2021-02-28 MED ORDER — WARFARIN SODIUM 5 MG PO TABS
5.0000 mg | ORAL_TABLET | Freq: Every day | ORAL | Status: DC
  Administered 2021-02-28 – 2021-03-01 (×2): 5 mg via ORAL
  Filled 2021-02-28 (×2): qty 1

## 2021-02-28 MED ORDER — POTASSIUM CHLORIDE CRYS ER 20 MEQ PO TBCR
40.0000 meq | EXTENDED_RELEASE_TABLET | Freq: Two times a day (BID) | ORAL | Status: AC
  Administered 2021-02-28 (×2): 40 meq via ORAL
  Filled 2021-02-28: qty 2
  Filled 2021-02-28: qty 4

## 2021-02-28 MED ORDER — MAGNESIUM SULFATE 2 GM/50ML IV SOLN
2.0000 g | Freq: Once | INTRAVENOUS | Status: AC
  Administered 2021-02-28: 2 g via INTRAVENOUS
  Filled 2021-02-28: qty 50

## 2021-02-28 MED ORDER — POTASSIUM CHLORIDE CRYS ER 20 MEQ PO TBCR
40.0000 meq | EXTENDED_RELEASE_TABLET | Freq: Once | ORAL | Status: AC
  Administered 2021-02-28: 40 meq via ORAL
  Filled 2021-02-28: qty 2

## 2021-02-28 MED ORDER — POTASSIUM CHLORIDE CRYS ER 20 MEQ PO TBCR: 20.0000 meq | EXTENDED_RELEASE_TABLET | Freq: Every day | ORAL | Status: DC

## 2021-02-28 NOTE — Progress Notes (Signed)
CARDIAC REHAB PHASE I   PRE:  Rate/Rhythm: 72 Paced, MD removed pacer, 56 bradycardia  BP:  Supine:   Sitting: 124/81     SaO2: 93% RA  MODE:  Ambulation: 400 ft   POST:  Rate/Rhythm: 79 SR  BP:  Supine:   Sitting: 110/70    SaO2: 97% RA  Pt was seated on EOB. MD had just removed pacer and HR was 56 without pacing. Pt was also off of O2 and sats were 93%. Pt used sternal precautions when standing up to walker. Pt ambulated for 400 ft on RA with no concerns. He did complain of weakness and feeling more tired today. His sats remained 97% with ambulation on RA. Returned pt seated on EOB with call bell in reach. Dicussed sternal precautions, exercise guidelines, heart healthy diet, restrictions, continued use of IS, and CRP II. Pt was receptive. Will send referral to [facility] for CRP II.  [id number]  Rick Duff, MS, ACSM-CEP 02/28/2021 11:39 AM

## 2021-02-28 NOTE — Progress Notes (Signed)
Patient EPW pulled per protocol and as ordered. All ends intact. Patient reminded to lie supine approximately one hour. Patient HR 58-60 on monitor and bp 108/79 will continue to monitor patient. Meko Bellanger, Bettina Gavia RN

## 2021-02-28 NOTE — Progress Notes (Signed)
Patient ambulated to bathroom and back to chair. Patient did have BM. Call bell with in reach. Will monitor patient. Lennie Vasco, Bettina Gavia RN

## 2021-02-28 NOTE — Progress Notes (Signed)
Mobility Specialist - Progress Note   02/28/21 1403  Mobility  Activity Refused mobility   Pt states he feels fatigued from his last walk and wants to rest. Will f/u as able.   Pricilla Handler Mobility Specialist Mobility Specialist Phone: [phone]

## 2021-02-28 NOTE — Progress Notes (Signed)
Patient states he is tired today, stated he got diaphoretic after walk earlier and wanted to wait to ambulate and wanted to rest. Patient states he just doesn't feel as well today as yesterday. VSS  Most recent BP 127/94 heart rate 59 on monitor patient on room air sats 94. Chest tube intact. Will continue monitor. Virgina Deakins, Bettina Gavia RN

## 2021-02-28 NOTE — Progress Notes (Addendum)
      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  Objective: Vital signs in last 24 hours: Temp:  [98 F (36.7 C)-98.8 F (37.1 C)] 98.3 F (36.8 C) (04/09 0325) Pulse Rate:  [69-100] 100 (04/09 0325) Cardiac Rhythm: Atrial paced (04/08 2038) Resp:  [13-20] 20 (04/09 0325) BP: (115-152)/(79-98) 152/98 (04/09 0325) SpO2:  [93 %-100 %] 95 % (04/09 0325) Weight:  [93.1 kg] 93.1 kg (04/09 0650)  Pre op weight 91.2 kg Current Weight  02/28/21 93.1 kg      Intake/Output from previous day: 04/08 0701 - 04/09 0700 In: 841.8 [P.O.:720; I.V.:21.8; IV Piggyback:100] Out: 8115 [Urine:4200; Chest Tube:340]   Physical Exam:  Cardiovascular: A paced Pulmonary: Slightly diminished right basilar breath sounds;left lung is clear Abdomen: Soft, non tender, bowel sounds present. Extremities: Mild bilateral lower extremity edema. Wounds: Aquacel removed and right anterior chest wound is clean and dry.  No erythema or signs of infection. Right groin with dried blood (no active bleeding or hematoma).  Lab Results: CBC: Recent Labs    02/27/21 0508 02/28/21 0107  WBC 16.1* 13.9*  HGB 11.3* 11.0*  HCT 34.1* 32.9*  PLT 133* 143*   BMET:  Recent Labs    02/27/21 0508 02/28/21 0107  NA 134* 135  K 4.0 3.5  CL 99 100  CO2 28 28  GLUCOSE 121* 107*  BUN 17 16  CREATININE 0.92 1.05  CALCIUM 8.4* 8.2*    PT/INR:  Lab Results  Component Value Date   INR 1.2 02/28/2021   INR 1.2 02/27/2021   INR 1.4 (H) 02/25/2021   ABG:  INR: Will add last result for INR, ABG once components are confirmed Will add last 4 CBG results once components are confirmed  Assessment/Plan:  1. CV - A paced at 70, junctional. On  Irbesartan 300 mg daily, HCTZ 12.5 mg daily, and Coumadin. INR this am remains 1.2. Will increase Coumadin dose in am if INR not increased. 2.  Pulmonary - On 2 liters of oxygen via [place]. Wean as able. Chest tubes with 340 cc of output last 24 hours. No CXR ordered but with output, will check CXR in am. Chest tubes to remain for now. 3. Volume Overload - On Lasix 40 mg IV bid.n Will transition to oral in am. 4.  Expected post op acute blood loss anemia - H and H this am stable at 11 and 32.9. 5. Thrombocytopenia-platelets this am up to 143,000 6. Supplement potassium 7. LOC constipation  Donielle M ZimmermanPA-C 02/28/2021,7:38 AM   I have seen and examined the patient and agree with the assessment and plan as outlined.  Maintaining NSR - will d/c pacing wires.  Leave chest tubes in one more day.  Stop lasix after next dose and continue Micardis/HCTZ.  Increase Coumadin.  Possible d/c home tomorrow if CXR looks good after tube removal.   Rexene Alberts, MD 02/28/2021 11:11 AM

## 2021-03-01 ENCOUNTER — Inpatient Hospital Stay (HOSPITAL_COMMUNITY): Payer: Medicare Other

## 2021-03-01 LAB — BASIC METABOLIC PANEL
Anion gap: 10 (ref 5–15)
BUN: 17 mg/dL (ref 8–23)
CO2: 30 mmol/L (ref 22–32)
Calcium: 9.1 mg/dL (ref 8.9–10.3)
Chloride: 94 mmol/L — ABNORMAL LOW (ref 98–111)
Creatinine, Ser: 1.04 mg/dL (ref 0.61–1.24)
GFR, Estimated: >60 mL/min (ref >60)
Glucose, Bld: 103 mg/dL — ABNORMAL HIGH (ref 70–99)
Potassium: 3.5 mmol/L (ref 3.5–5.1)
Sodium: 134 mmol/L — ABNORMAL LOW (ref 135–145)

## 2021-03-01 LAB — PROTIME-INR
INR: 1.2 (ref 0.8–1.2)
Prothrombin Time: 14.3 seconds (ref 11.4–15.2)

## 2021-03-01 MED ORDER — POTASSIUM CHLORIDE CRYS ER 20 MEQ PO TBCR
30.0000 meq | EXTENDED_RELEASE_TABLET | Freq: Two times a day (BID) | ORAL | Status: AC
  Administered 2021-03-01 (×2): 30 meq via ORAL
  Filled 2021-03-01 (×2): qty 1

## 2021-03-01 MED ORDER — AMIODARONE HCL 200 MG PO TABS
200.0000 mg | ORAL_TABLET | Freq: Two times a day (BID) | ORAL | Status: DC
  Administered 2021-03-01 – 2021-03-02 (×3): 200 mg via ORAL
  Filled 2021-03-01 (×3): qty 1

## 2021-03-01 MED ORDER — AMIODARONE IV BOLUS ONLY 150 MG/100ML
150.0000 mg | Freq: Once | INTRAVENOUS | Status: DC
  Filled 2021-03-01: qty 100

## 2021-03-01 NOTE — Progress Notes (Signed)
Mobility Specialist - Progress Note   03/01/21 1123  Mobility  Activity Ambulated in hall  Level of Assistance Standby assist, set-up cues, supervision of patient - no hands on  Assistive Device Front wheel walker  Distance Ambulated (ft) 470 ft  Mobility Response Tolerated fair  Mobility performed by Mobility specialist  $Mobility charge 1 Mobility   Pre-mobility, 2L O2: 61 HR, 106/67 BP, 100% SpO2 Post-mobility, RA: 62 HR, 115/76 BP, 100% SpO2  Pt stated he has felt "puny" all day, this feeling did not worsen w/ ambulation. Pt to recliner after walk and taken off of supplemental O2.   Pricilla Handler Mobility Specialist Mobility Specialist Phone: [phone]

## 2021-03-01 NOTE — Progress Notes (Signed)
Patient chest tubes removed as ordered. Patient tolerated well dressing applied. Will monitor patient. Tarnesha Ulloa, Bettina Gavia RN

## 2021-03-01 NOTE — Progress Notes (Signed)
Pt had went to the the bathroom became diaphoretic "felt weak" assisted back to bed BP 80/70, re checked 5 mins later  BP 11/83 pt says he feels better HR still 90's to low 100's Afib. Dr Roxy Manns paged no new orders at this time will continue to monitor.

## 2021-03-01 NOTE — Progress Notes (Signed)
Patient back in NSR. Will monitor patient. Breeze Angell, Bettina Gavia RN

## 2021-03-01 NOTE — Progress Notes (Addendum)
      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 He states "chest tubes still draining and I think I am dried out".  Objective: Vital signs in last 24 hours: Temp:  [97.8 F (36.6 C)-98.4 F (36.9 C)] 98.2 F (36.8 C) (04/10 0322) Pulse Rate:  [54-95] 90 (04/10 0700) Cardiac Rhythm: Sinus bradycardia;Atrial fibrillation;Other (Comment) (04/10 0150) Resp:  [11-20] 15 (04/10 0700) BP: (80-148)/(69-96) 119/93 (04/10 0700) SpO2:  [90 %-99 %] 96 % (04/10 0700) Weight:  [89.8 kg] 89.8 kg (04/10 0500)  Pre op weight 91.2 kg Current Weight  03/01/21 89.8 kg      Intake/Output from previous day: 04/09 0701 - 04/10 0700 In: 410 [P.O.:360; IV Piggyback:50] Out: 3159 [Urine:1600; Chest Tube:240]   Physical Exam:  Cardiovascular: IRRR IRRR Pulmonary: Slightly diminished right basilar breath sounds;left lung is clear Abdomen: Soft, non tender, bowel sounds present. Extremities: No lower extremity edema. Wounds: Right anterior chest wound is clean and dry.  No erythema or signs of infection. Right groin with dried blood (no active bleeding or hematoma).  Lab Results: CBC: Recent Labs    02/27/21 0508 02/28/21 0107  WBC 16.1* 13.9*  HGB 11.3* 11.0*  HCT 34.1* 32.9*  PLT 133* 143*   BMET:  Recent Labs    02/27/21 0508 02/28/21 0107  NA 134* 135  K 4.0 3.5  CL 99 100  CO2 28 28  GLUCOSE 121* 107*  BUN 17 16  CREATININE 0.92 1.05  CALCIUM 8.4* 8.2*    PT/INR:  Lab Results  Component Value Date   INR 1.2 03/01/2021   INR 1.2 02/28/2021   INR 1.2 02/27/2021   ABG:  INR: Will add last result for INR, ABG once components are confirmed Will add last 4 CBG results once components are  confirmed  Assessment/Plan:  1. CV - He went into a fib earlier this am with a mostly controlled rate. On Irbesartan 300 mg daily, HCTZ 12.5 mg daily, and Coumadin. INR this am remains 1.2. Will increase Coumadin dose (EPW remove yesterday). As discussed with Dr. Roxy Manns, will give IV Amiodarone bolus and start oral Amiodarone. e has had previous bradycardia so will dis2.  Pulmonary - On 2 liters of oxygen via [place]. Wean as able. Chest tubes with 240 cc of output (decreased) last 24 hours. Per Dr. Roxy Manns, will remove chest tubes. 3. Volume Overload - He appears to be below pre op weight after several days of IV diuresis. No diuresis today 4.  Expected post op acute blood loss anemia - Last H and H  stable at 11 and 32.9. 5. Thrombocytopenia-Last platelets up to 143,000 6. Possible discharge in am  Sharalyn Ink [facility] 03/01/2021,7:28 AM  I have seen and examined the patient and agree with the assessment and plan as outlined.  Back in NSR w/ HR 65-70.  Will cancel IV bolus but start amiodarone 200 bid.  Possible d/c home tomorrow if feeling better and maintaining NSR  Rexene Alberts, MD 03/01/2021 11:00 AM

## 2021-03-01 NOTE — Discharge Summary (Signed)
Physician Discharge Summary       [address]       [place] [address]             [phone]    Patient ID: Ricky Fitzgerald MRN: [id number] DOB/AGE: [birth year] 70 y.o.  Admit date: 02/25/2021 Discharge date: 03/02/2021  Admission Diagnoses:  Severe mitral regurgitation Discharge Diagnoses:  1.  S/P minimally-invasive mitral valve repair 2. Mild thrombocytopenia 3. A fib with CVR 4. History of Essential hypertension 5. History of hyperlipidemia  Consults: None  Procedure (s):  Minimally-Invasive Mitral Valve Repair             Complex valvuloplasty including artificial Gore-tex neochord placement x6             Sorin Memo 4D Ring Annuloplasty (size 84mm, catalog # [id number], serial # [id number]) by Dr. Roxy Manns on 02/25/2021.  History of Presenting Illness: Ricky Fitzgerald is a 70 year-old male with history of hypertension who has been referred for surgical consultation to discuss treatment options for management of recently discovered mitral valve prolapse with severe mitral regurgitation.   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  He was noted to have a prominent systolic murmur at the time of his most recent routine follow-up physical examination with his primary care physician.  Transthoracic echocardiogram was performed demonstrating the presence of normal left ventricular function with mitral valve prolapse and at least moderate to severe mitral regurgitation.  The patient was seen in consultation by Dr. Gwenlyn Found and subsequently underwent transesophageal echocardiogram October 22, 2020 confirming the presence of mitral valve prolapse involving the P3 segment of the posterior leaflet with ruptured primary chordae tendinae causing severe mitral regurgitation.  Left ventricular function remains preserved.  The patient was referred for surgical consultation.   Patient is single and lives locally in [place].  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  He has remained physically active, exercises regularly, and has had no significant physical limitations throughout adulthood.  He enjoys cycling and skiing and he has previously always exercised on a regular basis.  He states that this past summer he noticed a tendency to have slightly decreased physical endurance and exercise capacity when riding his bicycle.  He at times feels tachypalpitations that always seem to be induced by strenuous exertion.  He otherwise has remained essentially entirely asymptomatic.  He denies exertional shortness of breath, resting shortness of breath, PND, orthopnea, or lower extremity edema.  He has never had any significant chest pain or chest tightness.  He has essentially stopped exercising on a regular basis since he was found to have murmur on exam and severe mitral regurgitation.   The patient has made a decision to proceed with elective mitral valve repair and underwent diagnostic cardiac catheterization last month by Dr. Gwenlyn Found which revealed mild nonobstructive coronary artery disease.  At follow up visit with Dr. Roxy Manns, he reports no new problems or complaints.  He states that he did go skiing this winter and he noted a slight tendency to get short of breath more easily than a had in the past, although his symptoms of exertional shortness of breath did not limit him much at all.  He otherwise feels well and is  looking forward to proceeding with elective mitral valve repair.    Brief Hospital Course:   Ricky Fitzgerald presented to [facility] hospital on 02/25/2021.  He was taken to the operating room and underwent Minimally Invasive Mitral Valve Repair.  He tolerated the procedure without difficulty, was extubated and taken to the SICU in stable condition.   The patient did not require inotropic support.  He did develop decreased urinary output from foley catheter.  There were clots present when patient stood. The foley was irrigated without much success.  The foley catheter was repositioned and urine output improved.  He has a history of BPH and was restarted on his home Dutasteride.  He was started on coumadin for his Mitral Valve repair on POD #1.  He was started on IV Lasix for mild hypervolemia.  The patient was in a junctional bradycardia under his pacer maker.  This persisted and he was not started on a beta blocker.  He was hypertensive and resumed on his home Micardis/HCTZ.  The patient's foley catheter was removed on POD #2. He was weaned off the Insulin drip. His pre op HGA1C was 5.8. He was clinically stable and transferred to the progressive care unit on 02/27/2021. Epicardial pacing wires were removed on 04/09. He had expected post op blood loss anemia. His last H and H was 11 and 32.9. He had thrombocytopenia post op and his last platelet count was up to 143,000. Chest tubes still had a fair amount of output. Output decreased and on 04/10 were removed. Follow up CXR showed trace apical pneumothorax at 5%.  His INR was remaining 1.2 so Coumadin was increased to 5 mg. INR on 04/11 was 1.2.  Patient has an appointment to have PT/INR drawn on Wednesday 04/13.  Patient then went into a fib with CVR early on 04/10. He was given IV Amiodarone bolus and oral Amiodarone 200 mg bid. He has been tolerating a diet and has had a bowel movement. His wounds are clean, dry, and healing without signs of infection. He is felt surgically stable for discharge today.   Latest Vital Signs: Blood pressure 122/78, pulse 66, temperature 98.2 F (36.8 C), temperature source Oral, resp. rate 15, height 5\' 10"  (1.778 m), weight 198 lb (89.8 kg), SpO2 96 %.  Physical Exam:  General appearance: alert, cooperative and no distress Heart: regular rate and rhythm Lungs: clear to  auscultation bilaterally Abdomen: soft, non-tender; bowel sounds normal; no masses,  no organomegaly Extremities: edema none appreciated Wound: clean and dry, ecchymosis present  Discharge Condition: Stable and discharged to home.  Recent laboratory studies:  Lab Results  Component Value Date   WBC 13.9 (H) 02/28/2021   HGB 11.0 (L) 02/28/2021   HCT 32.9 (L) 02/28/2021   MCV 90.6 02/28/2021   PLT 143 (L) 02/28/2021   Lab Results  Component Value Date   NA 134 (L) 03/01/2021   K 3.5 03/01/2021   CL 94 (L) 03/01/2021   CO2 30 03/01/2021   CREATININE 1.04 03/01/2021   GLUCOSE 103 (H) 03/01/2021      Diagnostic Studies: DG Chest 2 View  Result Date: 03/01/2021 CLINICAL DATA:  RIGHT chest tube removal. EXAM: CHEST - 2 VIEW 3:17 p.m.: COMPARISON:  Portable chest x-ray earlier same day 9:31 a.m. and previously. FINDINGS: The 2 RIGHT chest tubes have been removed since earlier today. Very small (less than 5%) RIGHT apical pneumothorax. Atelectasis in the lung bases and in the RIGHT mid lung. Lungs otherwise clear. No  visible pleural effusions. IMPRESSION: 1. Very small (less than 5%) RIGHT apical pneumothorax post RIGHT chest tube removal. 2. Atelectasis in the lung bases and in the RIGHT mid lung. No acute cardiopulmonary disease otherwise. Electronically Signed   By: Evangeline Dakin M.D.   On: 03/01/2021 19:01   DG Chest 2 View  Result Date: 02/23/2021 CLINICAL DATA:  70 year old male with a history preop study for cardiac surge EXAM: CHEST - 2 VIEW COMPARISON:  CT chest 11/26/2020 FINDINGS: Cardiomediastinal silhouette within normal limits. No evidence of central vascular congestion. No interlobular septal thickening. Low lung volumes. No pneumothorax pleural effusion or confluent airspace disease. No displaced fracture.  Degenerative changes of the spine IMPRESSION: No active cardiopulmonary disease. Electronically Signed   By: Corrie Mckusick D.O.   On: 02/23/2021 12:35   CARDIAC  CATHETERIZATION  Result Date: 02/19/2021  Mid LAD-1 lesion is 40% stenosed.  Mid LAD-2 lesion is 40% stenosed.  Prox RCA lesion is 40% stenosed.  Hemodynamic findings consistent with mitral valve regurgitation.  Ricky Fitzgerald is a 70 y.o. male  [id number] LOCATION:  FACILITY: [facility] PHYSICIAN: Quay Burow, M.D. Feb 15, 1951 DATE OF PROCEDURE:  02/19/2021 DATE OF DISCHARGE: CARDIAC CATHETERIZATION History obtained from chart review.Ricky Fitzgerald is a 70 y.o. 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 Ardeth Perfect, for evaluation of severe mitral vegetation.  His only risk factor for cardiovascular disease is treated hypertension.  There is no family history for heart disease.  Never had an attack or stroke.  He denies chest pain or shortness of breath.  He is very active and rides bikes and skis in the winter.  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  He he has noticed progressive decreased exercise tolerance.  He is very active.  He had a TEE by Dr. Sallyanne Kuster that showed severe MR with P3 that was flail.  He presents now for right left heart cath to define his anatomy and physiology in anticipation of a minimally invasive mitral valve repair by Dr. Roxy Manns.   Mr. Janek has diffuse mild CAD but nothing that was obstructive.  His LVEDP was mildly elevated and his V wave was 27.  He is a good candidate for minimally invasive mitral valve repair.  He will not need coronary revascularization.  The radial sheath was removed and a TR band was placed on the right wrist to achieve patent hemostasis.  The patient left lab in stable condition.  He will be discharged home later today as an outpatient  and will see me back in the office in 1 week for follow-up at which time I will refer him back to Dr. Roxy Manns to complete his work-up. Quay Burow. MD, [facility] 02/19/2021 2:14 PM   DG CHEST PORT 1 VIEW  Result Date: 03/01/2021 CLINICAL DATA:  Shortness of breath EXAM: PORTABLE CHEST 1 VIEW COMPARISON:  February 27, 2021 FINDINGS: Chest tubes are again noted on the right. No appreciable pneumothorax. There are areas of atelectatic change in the right mid lung and right base regions. No edema or airspace opacity. Heart is mildly enlarged with pulmonary vascularity normal. No adenopathy. No bone lesions. IMPRESSION: Chest tubes remain on  the right, unchanged in position. No pneumothorax. Areas of atelectatic change on the right noted. No edema or consolidation. Stable cardiac prominence. Electronically Signed   By: Lowella Grip III M.D.   On: 03/01/2021 11:44   DG Chest Port 1 View  Result Date: 02/27/2021 CLINICAL DATA:  Status post cardiac surgery. EXAM: PORTABLE CHEST 1 VIEW COMPARISON:  February 26, 2021. FINDINGS: Stable cardiomediastinal silhouette. Two right-sided chest tubes are noted without pneumothorax. Bibasilar subsegmental atelectasis is noted. Bony thorax is unremarkable. IMPRESSION: Stable position of right-sided chest tubes without pneumothorax. Bibasilar subsegmental atelectasis. Electronically Signed   By: Marijo Conception M.D.   On: 02/27/2021 08:35   DG Chest Port 1 View  Result Date: 02/26/2021 CLINICAL DATA:  Chest tube.  Open-heart surgery. EXAM: PORTABLE CHEST 1 VIEW COMPARISON:  02/25/2021.  02/23/2021. FINDINGS: Left IJ line in stable position. Two right chest tubes in stable position. Previously described mediastinal widening is unchanged. Again mediastinal process cannot be completely excluded. Low lung volumes with persistent bibasilar atelectasis. Tiny right pleural effusion cannot be excluded. No pneumothorax gastric distention. IMPRESSION: 1. Left IJ line stable position. Two right  chest tubes in stable position. No pneumothorax. 2. Previously described mediastinal widening is unchanged. Again a mediastinal process cannot be completely excluded. 3. Low lung volumes with persistent bibasilar atelectasis. Tiny right pleural effusion cannot be excluded. 4.  Gastric distention. Electronically Signed   By: Marcello Moores  Register   On: 02/26/2021 06:43   DG Chest Port 1 View  Addendum Date: 02/25/2021   ADDENDUM REPORT: 02/25/2021 15:31 ADDENDUM: Findings conveyed toCLARENCE Fitzgerald on 02/25/2021  at15:31. Electronically Signed   By: Suzy Bouchard M.D.   On: 02/25/2021 15:31   Result Date: 02/25/2021 CLINICAL DATA:  Reason for exam: Atelectasis S/P MINIMALLY INVASIVE MITRAL VALVE REPAIR (MVR) USING MEMO 4D 30MM RING EXAM: PORTABLE CHEST 1 VIEW COMPARISON:  Chest radiograph 02/23/2021 FINDINGS: Increased prominence of the upper mediastinum compared to radiograph 02/23/2021. Patient semi-upright however cannot exclude expansion of the aorta or hematoma in the mediastinum. Upper mediastinum measures 13.5 cm compared to 10.1 cm on comparison upright exam. Chest tube in the RIGHT hemithorax.  No pneumothorax. IMPRESSION: 1. Increased width of the upper mediastinum. While this may be in part do to semi upright positioning, cannot exclude hematoma in the upper mediastinum or abnormal aortic contour. Consider CT of the thorax with contrast for further evaluation. 2. Two RIGHT chest tubes in place without pneumothorax. Electronically Signed: By: Suzy Bouchard M.D. On: 02/25/2021 15:19   ECHO INTRAOPERATIVE TEE  Result Date: 02/25/2021  *INTRAOPERATIVE TRANSESOPHAGEAL REPORT *  Patient Name:   Ricky Fitzgerald    Date of Exam: 02/25/2021 Medical Rec #:  [id number]      Height:       70.0 in Accession #:    [id number]     Weight:       201.0 lb Date of Birth:  1951-02-13      BSA:          2.09 m Patient Age:    60 years       BP:           168/103 mmHg Patient Gender: M              HR:           60 bpm. Exam  Location:  Anesthesiology Transesophogeal exam was perform intraoperatively during surgical procedure. Patient was closely monitored under general anesthesia during the entirety of examination.  Indications:     Mitral valve prolapse [I34.1 (ICD-10-CM)]; Severe mitral                  regurgitation [I34.0 (ICD-10-CM)] Performing Phys: [id number] Ricky H Fitzgerald Complications: No known complications during this procedure. PRE-OP FINDINGS  Left Ventricle: The left ventricle has normal systolic function, with an ejection fraction of 55-60%. The cavity size was normal. There is no left ventricular hypertrophy. On the post-bypass exam, the LV systolic function was normal with an estimated EF  55-60%. Right Ventricle: The right ventricle has normal systolic function. The cavity was normal. There is no increase in right ventricular wall thickness. On the post-bypass exam, the RV systolic was moderately reduced. Over the next 30-60 minutes, the RV systolic function improved and appeared normal at the completion of the procedure. Left Atrium: No left atrial/left atrial appendage thrombus was detected. The left atrium was dilated and measured 5.15 cm in the medial-lateral dimension. Right Atrium: Right atrial size was normal in size. Interatrial Septum: No atrial level shunt detected by color flow Doppler. Agitated saline contrast was given intravenously to evaluate for intracardiac shunting. Agitated saline contrast bubble study was negative, with no evidence of any interatrial shunt. Pericardium: There is no evidence of pericardial effusion. Mitral Valve: There was severe mitral regurgitaion. This was due to a flail posterior leaflet segment in the P3 region. There were at least two torn chordae identified. There was an anteriorly directed wall hugging jet of mitral insufficiency which tracked along the superior surface of the anterior leaflet and along the anterior wall of the left atrium. There were no other flail or prolapsing  leaflet segments identified. The leaflets were moderately thickeded. On th post-bypass exam, there was an annuloplasty ring in the mitral position. The posterior leaflet was fixed and the anterior leaflet was freely mobile. There was no residual mitral regurgitation. The mean trans-mitral gradient was 3 mm hg by CW Doppler. There was no evidence of systolic anterior motion of the mitral leaflets. Tricuspid Valve: The tricuspid valve was normal in structure. Tricuspid valve regurgitation is mild by color flow Doppler. No evidence of tricuspid stenosis is present. Aortic Valve: The aortic valve is tricuspid Aortic valve regurgitation was not visualized by color flow Doppler. There is no stenosis of the aortic valve. Pulmonic Valve: The pulmonic valve was normal in structure. Pulmonic valve regurgitation is trivial by color flow Doppler. Aorta: There was a well defined aortic root and sino-tubular junction without dilataion or effacement. There was mild intimal thickening but no protruding atheromatous disease noted. Aortic root: 3.59 cm Sino-tubular junction: 2.86 cm Proximal ascending aorta: 3.21 cm The descending aorta was normal in diameter and measured 2.86 cm. +-------------+---------++ MITRAL VALVE           +-------------+---------++ MV Peak grad:8.2 mmHg  +-------------+---------++ MV Mean grad:3.0 mmHg  +-------------+---------++ MV Vmax:     1.43 m/s  +-------------+---------++ MV Vmean:    82.4 cm/s +-------------+---------++ MV VTI:      0.27 m    +-------------+---------++  Roberts Gaudy MD Electronically signed by Roberts Gaudy MD Signature Date/Time: 02/25/2021/7:36:39 PM    Final    VAS US DOPPLER PRE CABG  Result Date: 02/23/2021 PREOPERATIVE VASCULAR EVALUATION  Indications:      Pre-CABG. Risk Factors:     Hypertension, hyperlipidemia. Other Factors:    SEVERE MVR. Comparison Study: No previous Performing Technologist: Vonzell Schlatter RVT  Examination Guidelines: A complete  evaluation includes B-mode imaging, spectral Doppler, color Doppler, and  power Doppler as needed of all accessible portions of each vessel. Bilateral testing is considered an integral part of a complete examination. Limited examinations for reoccurring indications may be performed as noted.  Right Carotid Findings: +----------+--------+--------+--------+--------+--------+           PSV cm/sEDV cm/sStenosisDescribeComments +----------+--------+--------+--------+--------+--------+ CCA Prox  159     28                               +----------+--------+--------+--------+--------+--------+ CCA Distal99      21                               +----------+--------+--------+--------+--------+--------+ ICA Prox  79      21      Normal                   +----------+--------+--------+--------+--------+--------+ ICA Distal57      18                               +----------+--------+--------+--------+--------+--------+ ECA       107     17                               +----------+--------+--------+--------+--------+--------+ Portions of this table do not appear on this page. +----------+--------+-------+--------+------------+           PSV cm/sEDV cmsDescribeArm Pressure +----------+--------+-------+--------+------------+ Subclavian106                                 +----------+--------+-------+--------+------------+ +---------+--------+--+--------+--+ VertebralPSV cm/s60EDV cm/s12 +---------+--------+--+--------+--+ Left Carotid Findings: +----------+--------+--------+--------+--------+--------+           PSV cm/sEDV cm/sStenosisDescribeComments +----------+--------+--------+--------+--------+--------+ CCA Prox  104     19                               +----------+--------+--------+--------+--------+--------+ CCA Distal83      18                               +----------+--------+--------+--------+--------+--------+ ICA Prox  56      19       Normal                   +----------+--------+--------+--------+--------+--------+ ICA Distal90      35                               +----------+--------+--------+--------+--------+--------+ ECA       133     16                               +----------+--------+--------+--------+--------+--------+ +----------+--------+--------+--------+------------+ SubclavianPSV cm/sEDV cm/sDescribeArm Pressure +----------+--------+--------+--------+------------+           109                                  +----------+--------+--------+--------+------------+ +---------+--------+--+--------+--+ VertebralPSV cm/s51EDV cm/s18 +---------+--------+--+--------+--+  ABI Findings: +---------+------------------+-----+---------+--------+ Right    Rt Pressure (mmHg)IndexWaveform Comment  +---------+------------------+-----+---------+--------+ Brachial  151                    triphasic         +---------+------------------+-----+---------+--------+ PTA      200               1.32 biphasic          +---------+------------------+-----+---------+--------+ DP       175               1.16 biphasic          +---------+------------------+-----+---------+--------+ Great Toe157               1.04 Normal            +---------+------------------+-----+---------+--------+ +---------+------------------+-----+---------+-------+ Left     Lt Pressure (mmHg)IndexWaveform Comment +---------+------------------+-----+---------+-------+ Brachial 150                    triphasic        +---------+------------------+-----+---------+-------+ PTA      203               1.34 biphasic         +---------+------------------+-----+---------+-------+ DP       212               1.40 biphasic         +---------+------------------+-----+---------+-------+ Adair Patter               0.88 Abnormal         +---------+------------------+-----+---------+-------+ Arterial wall  calcification precludes accurate ankle pressures and ABIs.  Right Doppler Findings: +-----------+--------+-----+---------+--------+ Site       PressureIndexDoppler  Comments +-----------+--------+-----+---------+--------+ Brachial   151          triphasic         +-----------+--------+-----+---------+--------+ Radial                  triphasic         +-----------+--------+-----+---------+--------+ Ulnar                   triphasic         +-----------+--------+-----+---------+--------+ Palmar Arch             triphasic         +-----------+--------+-----+---------+--------+  Left Doppler Findings: +-----------+--------+-----+---------+--------+ Site       PressureIndexDoppler  Comments +-----------+--------+-----+---------+--------+ Brachial   150          triphasic         +-----------+--------+-----+---------+--------+ Radial                  triphasic         +-----------+--------+-----+---------+--------+ Ulnar                   triphasic         +-----------+--------+-----+---------+--------+ Palmar Arch             triphasic         +-----------+--------+-----+---------+--------+  Summary: Right Carotid: There was no evidence of thrombus, dissection, atherosclerotic                plaque or stenosis in the cervical carotid system. Left Carotid: There was no evidence of thrombus, dissection, atherosclerotic               plaque or stenosis in the cervical carotid system. Vertebrals:  Bilateral vertebral arteries demonstrate antegrade flow. Subclavians: Normal flow hemodynamics were seen in bilateral subclavian  arteries. Right ABI: Resting right ankle-brachial index indicates noncompressible right lower extremity arteries. The right toe-brachial index is normal. ABIs are unreliable. Left ABI: Resting left ankle-brachial index indicates noncompressible left lower extremity arteries. The left toe-brachial index is normal. ABIs are  unreliable. PPG tracings appear dampened. Right Upper Extremity: Doppler waveforms remain within normal limits with right radial compression. Doppler waveforms remain within normal limits with right ulnar compression. Left Upper Extremity: Doppler waveforms remain within normal limits with left radial compression. Doppler waveforms remain within normal limits with left ulnar compression.  Electronically signed by Ruta Hinds MD on 02/23/2021 at 3:01:58 PM.    Final      Discharge Instructions     Amb Referral to Cardiac Rehabilitation   Complete by: As directed    Diagnosis: Valve Repair   Valve: Mitral   After initial evaluation and assessments completed: Virtual Based Care may be provided alone or in conjunction with Phase 2 Cardiac Rehab based on patient barriers.: Yes       Discharge Medications: Allergies as of 03/02/2021   No Known Allergies      Medication List     TAKE these medications    amiodarone 200 MG tablet Commonly known as: PACERONE Take 1 tablet (200 mg total) by mouth 2 (two) times daily.   aspirin 81 MG chewable tablet Chew 81 mg by mouth once. Pre cath   augmented betamethasone dipropionate 0.05 % cream Commonly known as: DIPROLENE-AF Apply 1 application topically daily as needed (irritation).   Coenzyme Q10 100 MG Tabs Take 100 mg by mouth daily.   desonide 0.05 % ointment Commonly known as: DESOWEN Apply 1 application topically daily as needed (irritation).   dutasteride 0.5 MG capsule Commonly known as: AVODART Take 0.5 mg by mouth daily.   multivitamin per tablet Take 1 tablet by mouth daily.   RED YEAST RICE PO Take 1 tablet by mouth daily at 12 noon.   telmisartan-hydrochlorothiazide 80-12.5 MG tablet Commonly known as: Micardis HCT Take 1 tablet by mouth daily.   traMADol 50 MG tablet Commonly known as: ULTRAM Take 1 tablet (50 mg total) by mouth every 4 (four) hours as needed for moderate pain.   Vitamin D-3 125 MCG (5000 UT)  Tabs Take 5,000 Units by mouth in the morning and at bedtime. What changed: how much to take   warfarin 5 MG tablet Commonly known as: COUMADIN Take 1 tablet (5 mg total) by mouth daily at 4 PM.       The patient has been discharged on:   1.Beta Blocker:  Yes [   ]                              No   [ x  ]                              If No, reason: Bradycardia  2.Ace Inhibitor/ARB: Yes [  x ]                                     No  [    ]  If No, reason:  3.Statin:   Yes [   ]                  No  [  x ]                  If No, reason:No CAD  4.Shela Commons:  Yes  [  x ]                  No   [   ]                  If No, reason:  Follow Up Appointments:  Follow-up Information     [facility] Follow up on 03/04/2021.   Specialty: Cardiothoracic Surgery Why: Appointment is at 10:30 with nurse for suture removal Contact information: [place], [place] [phone]        [facility] Follow up on 03/16/2021.   Specialty: Cardiothoracic Surgery Why: Appointment is at 3:30, please get CXR at 3:00 at [place] located on first floor of our office building Contact information: [place], [place] Buhl, Addison, NP Follow up on 03/24/2021.   Specialty: Cardiology Why: Appoinmtent is at 9:15 Contact information: [address] [place] [address] [phone]         [facility] Follow up on 03/04/2021.   Why: Appointment is at 10:30 for PT/INR check... this is the same office as your Cardiologist Contact information: [facility] [address] [place] [address]                Signed: Ellamae Sia 03/02/2021, 11:52 AM

## 2021-03-01 NOTE — Progress Notes (Signed)
Patient ambulated in hallway 470 feet with rolling walker with nursing staff. Will monitor patient. Abie Cheek, Bettina Gavia rN

## 2021-03-01 NOTE — Progress Notes (Signed)
Pt went into afib in the 80-90's asymptomatic

## 2021-03-02 LAB — PROTIME-INR
INR: 1.2 (ref 0.8–1.2)
Prothrombin Time: 15.1 seconds (ref 11.4–15.2)

## 2021-03-02 MED ORDER — MULTIVITAMINS PO TABS: 1.0000 | ORAL_TABLET | Freq: Every day | ORAL | Status: AC

## 2021-03-02 MED ORDER — AMIODARONE HCL 200 MG PO TABS: 200.0000 mg | ORAL_TABLET | Freq: Two times a day (BID) | ORAL | 1 refills | Status: DC

## 2021-03-02 MED ORDER — WARFARIN SODIUM 5 MG PO TABS: 5.0000 mg | ORAL_TABLET | Freq: Every day | ORAL | 3 refills | Status: DC

## 2021-03-02 MED ORDER — VITAMIN D-3 125 MCG (5000 UT) PO TABS: 5000.0000 [IU] | ORAL_TABLET | Freq: Two times a day (BID) | ORAL | Status: AC

## 2021-03-02 MED ORDER — TRAMADOL HCL 50 MG PO TABS: 50.0000 mg | ORAL_TABLET | ORAL | 0 refills | Status: DC | PRN

## 2021-03-02 MED FILL — Lidocaine HCl Local Preservative Free (PF) Inj 2%: INTRAMUSCULAR | Qty: 15 | Status: AC

## 2021-03-02 MED FILL — Potassium Chloride Inj 2 mEq/ML: INTRAVENOUS | Qty: 40 | Status: AC

## 2021-03-02 MED FILL — Heparin Sodium (Porcine) Inj 1000 Unit/ML: INTRAMUSCULAR | Qty: 30 | Status: AC

## 2021-03-02 NOTE — Progress Notes (Signed)
Discharge instructions (including medications) discussed with and copy provided to patient/caregiver 

## 2021-03-02 NOTE — Progress Notes (Signed)
CARDIAC REHAB PHASE I   D/c education completed with pt. Pt educated on importance of site care and monitoring incisions daily. Encouraged continued IS use and walks. Reviewed restrictions and exercise guidelines. Pt denies DME needs at this time. Referred to [facility] [place].  [id number] Rufina Falco, RN BSN 03/02/2021 8:52 AM

## 2021-03-02 NOTE — Progress Notes (Addendum)
      [address]       [place] [address]             [phone]      5 Days Post-Op Procedure(s) (LRB): MINIMALLY INVASIVE MITRAL VALVE REPAIR (MVR) USING MEMO 4D 30MM RING (Right) TRANSESOPHAGEAL ECHOCARDIOGRAM (TEE) (N/A)   Subjective:  No new complaints.  Patient is feeling pretty good, feels up to going home.  Objective: Vital signs in last 24 hours: Temp:  [98.4 F (36.9 C)-99.1 F (37.3 C)] 98.5 F (36.9 C) (04/11 0510) Pulse Rate:  [55-66] 56 (04/11 0510) Cardiac Rhythm: Sinus bradycardia;Normal sinus rhythm (04/10 1925) Resp:  [15-20] 15 (04/11 0510) BP: (107-147)/(76-98) 147/91 (04/11 0510) SpO2:  [94 %-100 %] 94 % (04/11 0510) Weight:  [89.8 kg] 89.8 kg (04/11 0319)  Intake/Output from previous day: 04/10 0701 - 04/11 0700 In: 480 [P.O.:480] Out: 300 [Urine:300]  General appearance: alert, cooperative and no distress Heart: regular rate and rhythm Lungs: clear to auscultation bilaterally Abdomen: soft, non-tender; bowel sounds normal; no masses,  no organomegaly Extremities: edema none appreciated Wound: clean and dry, ecchymosis present  Lab Results: Recent Labs    02/28/21 0107  WBC 13.9*  HGB 11.0*  HCT 32.9*  PLT 143*   BMET:  Recent Labs    02/28/21 0107 03/01/21 1044  NA 135 134*  K 3.5 3.5  CL 100 94*  CO2 28 30  GLUCOSE 107* 103*  BUN 16 17  CREATININE 1.05 1.04  CALCIUM 8.2* 9.1    PT/INR:  Recent Labs    03/02/21 0119  LABPROT 15.1  INR 1.2   ABG    Component Value Date/Time   PHART 7.382 02/25/2021 2144   HCO3 23.8 02/25/2021 2144   TCO2 25 02/25/2021 2144   ACIDBASEDEF 1.0 02/25/2021 2144   O2SAT 94.0 02/25/2021 2144   CBG (last 3)  No results for input(s): GLUCAP in the last 72 hours.  Assessment/Plan: S/P Procedure(s) (LRB): 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 CV-Brief A. Fib yesterday, Sinus  Bradycardia, BP controlled- continue Amiodarone, Micardis 2. INR 1.2, will continue coumadin at 5 mg daily, with repeat INR later this week 3. Pulm- no acute issues, continue IS, CTs are out, trace 5% apical pneumothorax on the right 4. Renal- creatinine, lytes have been stable, no edema on exam, no indication for lasix at this time 5. Dispo- patient stable, maintaining Sinus Bradycardia, continue coumadin, will d/c home today   LOS: 5 days    Ellwood Handler, PA-C 03/02/2021  I have seen and examined the patient and agree with the assessment and plan as outlined.  Rexene Alberts, MD 03/02/2021 9:27 AM

## 2021-03-02 NOTE — Care Management Important Message (Signed)
Important Message  Patient Details  Name: Ricky Fitzgerald MRN: [id number] Date of Birth: November 27, 1950   Medicare Important Message Given:  Yes     Shelda Altes 03/02/2021, 9:50 AM

## 2021-03-02 NOTE — Plan of Care (Signed)
  Problem: Education: Goal: Knowledge of General Education information will improve Description: Including pain rating scale, medication(s)/[place] effects and non-pharmacologic comfort measures Outcome: Adequate for Discharge   

## 2021-03-04 ENCOUNTER — Telehealth: Payer: Self-pay

## 2021-03-04 ENCOUNTER — Encounter: Payer: Self-pay | Admitting: Thoracic Surgery (Cardiothoracic Vascular Surgery)

## 2021-03-04 ENCOUNTER — Other Ambulatory Visit: Payer: Self-pay | Admitting: Thoracic Surgery (Cardiothoracic Vascular Surgery)

## 2021-03-04 ENCOUNTER — Ambulatory Visit (INDEPENDENT_AMBULATORY_CARE_PROVIDER_SITE_OTHER): Payer: Self-pay | Admitting: Physician Assistant

## 2021-03-04 ENCOUNTER — Other Ambulatory Visit: Payer: Self-pay | Admitting: Physician Assistant

## 2021-03-04 ENCOUNTER — Ambulatory Visit
Admission: RE | Admit: 2021-03-04 | Discharge: 2021-03-04 | Disposition: A | Discharge status: Home / Self Care | Payer: Medicare Other | Source: Ambulatory Visit | Attending: Thoracic Surgery (Cardiothoracic Vascular Surgery) | Admitting: Thoracic Surgery (Cardiothoracic Vascular Surgery)

## 2021-03-04 ENCOUNTER — Ambulatory Visit (INDEPENDENT_AMBULATORY_CARE_PROVIDER_SITE_OTHER): Payer: Medicare Other

## 2021-03-04 ENCOUNTER — Telehealth: Payer: Self-pay | Admitting: *Deleted

## 2021-03-04 ENCOUNTER — Other Ambulatory Visit: Payer: Self-pay

## 2021-03-04 VITALS — BP 116/84 | HR 69 | Resp 20 | Ht 70.0 in | Wt 194.0 lb

## 2021-03-04 DIAGNOSIS — R0782 Intercostal pain: Secondary | ICD-10-CM

## 2021-03-04 DIAGNOSIS — Z5181 Encounter for therapeutic drug level monitoring: Secondary | ICD-10-CM

## 2021-03-04 DIAGNOSIS — I341 Nonrheumatic mitral (valve) prolapse: Secondary | ICD-10-CM

## 2021-03-04 DIAGNOSIS — Z9889 Other specified postprocedural states: Secondary | ICD-10-CM

## 2021-03-04 DIAGNOSIS — I340 Nonrheumatic mitral (valve) insufficiency: Secondary | ICD-10-CM

## 2021-03-04 DIAGNOSIS — Z7901 Long term (current) use of anticoagulants: Secondary | ICD-10-CM | POA: Diagnosis not present

## 2021-03-04 DIAGNOSIS — S20211A Contusion of right front wall of thorax, initial encounter: Secondary | ICD-10-CM

## 2021-03-04 DIAGNOSIS — I4891 Unspecified atrial fibrillation: Secondary | ICD-10-CM | POA: Insufficient documentation

## 2021-03-04 LAB — POCT INR: INR: 1.6 — AB (ref 2.0–3.0)

## 2021-03-04 NOTE — Progress Notes (Signed)
HPI:  [name] contacted the office concerning new increase in swelling along his surgical incision.  He states that he woke up and noticed the swelling was increased.  He has had some episodes of coughing, none of which were severe.  The patient is doing well, other than some mild fatigue.  Current Outpatient Medications  Medication Sig Dispense Refill  . amiodarone (PACERONE) 200 MG tablet Take 1 tablet (200 mg total) by mouth 2 (two) times daily. 60 tablet 1  . aspirin 81 MG chewable tablet Chew 81 mg by mouth once. Pre cath    . augmented betamethasone dipropionate (DIPROLENE-AF) 0.05 % cream Apply 1 application topically daily as needed (irritation).    . Cholecalciferol (VITAMIN D-3) 125 MCG (5000 UT) TABS Take 5,000 Units by mouth in the morning and at bedtime. 30 tablet   . Coenzyme Q10 100 MG TABS Take 100 mg by mouth daily.    Marland Kitchen desonide (DESOWEN) 0.05 % ointment Apply 1 application topically daily as needed (irritation).    Marland Kitchen dutasteride (AVODART) 0.5 MG capsule Take 0.5 mg by mouth daily.    . multivitamin (THERAGRAN) per tablet Take 1 tablet by mouth daily.    . Red Yeast Rice Extract (RED YEAST RICE PO) Take 1 tablet by mouth daily at 12 noon.    Marland Kitchen telmisartan-hydrochlorothiazide (MICARDIS HCT) 80-12.5 MG per tablet Take 1 tablet by mouth daily. 30 tablet 11  . traMADol (ULTRAM) 50 MG tablet Take 1 tablet (50 mg total) by mouth every 4 (four) hours as needed for moderate pain. 30 tablet 0  . warfarin (COUMADIN) 5 MG tablet Take 1 tablet (5 mg total) by mouth daily at 4 PM. 30 tablet 3   No current facility-administered medications for this visit.    Physical Exam:  BP 116/84   Pulse 69   Resp 20   Ht 5\' 10"  (1.778 m)   Wt 194 lb (88 kg)   SpO2 94% Comment: RA  BMI 27.84 kg/m   Gen: no apparent distress Heart: RRR Lungs: CTA bilaterally Incision- healing without evidence of infection, ecchymosis present, there is some minor swelling present, no erythema or induration  present  Diagnostic Tests:   CT of Chest: (reviewed with Dr. Roxy Manns)  No evidence of incisional hematoma present.  No pneumothorax present.  There is evidence of gas in area of incisions and air along right chest  A/P:  1. Patient is progressing as expected.  His incision is swollen with ecchymosis, but CT scan was negative for hematoma.  There is small gas collection on CT scan 2. INR 1.6, continue coumadin at 5 mg daily 3. Dispo- patient stable, no evidence of incisional hematoma.  INR trending up appropriately, continue coumadin.  Will have patient come to office next week to remove suture.  His wound can be checked at that time.  He will contact our office should further issues arise.   Ellwood Handler, PA-C [facility] [phone]

## 2021-03-04 NOTE — Telephone Encounter (Signed)
Ricky Fitzgerald contacted the office stating within the last 12 hours the right Ricky Fitzgerald of his chest has become increasingly swollen and puffy. Patient is s/p Mini MVR 02/25/21 by Dr. Roxy Manns. Patient denies SOB and diaphoresis. States swelling is localized to right Ricky Fitzgerald of his chest. Photo sent in by patient to RN office phone. Ellwood Handler, PA, and Dr. Roxy Manns were consulted. Per Dr. Roxy Manns, CT scan of chest, no contrast ordered for patient to be done today. Appt made for patient to see Dr. Roxy Manns today at 2:30. Patient to go to coumadin clinic prior for recent PT/INR. [name] made aware.

## 2021-03-04 NOTE — Patient Instructions (Signed)
Take 1 tablet Daily.  INR in 1 week.  A full discussion of the nature of anticoagulants has been carried out.  A benefit risk analysis has been presented to the patient, so that they understand the justification for choosing anticoagulation at this time. The need for frequent and regular monitoring, precise dosage adjustment and compliance is stressed.  Carmino effects of potential bleeding are discussed.  The patient should avoid any OTC items containing aspirin or ibuprofen, and should avoid great swings in general diet.  Avoid alcohol consumption.  Call if any signs of abnormal bleeding.  [phone].

## 2021-03-04 NOTE — Telephone Encounter (Signed)
[email or web address] regarding upcoming appointments.

## 2021-03-04 NOTE — Telephone Encounter (Signed)
Spoke to the patient and confirmed [facility] Coumadin appointment @ 2:30.  Verbalized understanding.

## 2021-03-09 ENCOUNTER — Telehealth (HOSPITAL_COMMUNITY): Payer: Self-pay

## 2021-03-09 NOTE — Telephone Encounter (Signed)
Called and spoke with pt in regards to CR, pt stated not at this time due to work.   Closed referral

## 2021-03-10 ENCOUNTER — Ambulatory Visit (INDEPENDENT_AMBULATORY_CARE_PROVIDER_SITE_OTHER): Payer: Self-pay | Admitting: Physician Assistant

## 2021-03-10 ENCOUNTER — Other Ambulatory Visit: Payer: Self-pay

## 2021-03-10 DIAGNOSIS — Z9889 Other specified postprocedural states: Secondary | ICD-10-CM

## 2021-03-10 DIAGNOSIS — Z4802 Encounter for removal of sutures: Secondary | ICD-10-CM

## 2021-03-10 NOTE — Progress Notes (Signed)
  HPI:  Patient returns for suture removal today. Nurse asked me to evaluate patient's right swollen hand. Patient is s/p minimally invasive mitral valve repair on 02/25/2021 by Dr. Roxy Manns. Per the patient, he noticed his right hand become swollen this past Friday 04/15. He denies any trauma. He denies any redness, fever, chills, or forearm or upper arm swelling or pain.  Current Outpatient Medications  Medication Sig Dispense Refill  . amiodarone (PACERONE) 200 MG tablet Take 1 tablet (200 mg total) by mouth 2 (two) times daily. 60 tablet 1  . aspirin 81 MG chewable tablet Chew 81 mg by mouth once. Pre cath    . augmented betamethasone dipropionate (DIPROLENE-AF) 0.05 % cream Apply 1 application topically daily as needed (irritation).    . Cholecalciferol (VITAMIN D-3) 125 MCG (5000 UT) TABS Take 5,000 Units by mouth in the morning and at bedtime. 30 tablet   . Coenzyme Q10 100 MG TABS Take 100 mg by mouth daily.    Marland Kitchen desonide (DESOWEN) 0.05 % ointment Apply 1 application topically daily as needed (irritation).    Marland Kitchen dutasteride (AVODART) 0.5 MG capsule Take 0.5 mg by mouth daily.    . multivitamin (THERAGRAN) per tablet Take 1 tablet by mouth daily.    . Red Yeast Rice Extract (RED YEAST RICE PO) Take 1 tablet by mouth daily at 12 noon.    Marland Kitchen telmisartan-hydrochlorothiazide (MICARDIS HCT) 80-12.5 MG per tablet Take 1 tablet by mouth daily. 30 tablet 11  . traMADol (ULTRAM) 50 MG tablet Take 1 tablet (50 mg total) by mouth every 4 (four) hours as needed for moderate pain. 30 tablet 0  . warfarin (COUMADIN) 5 MG tablet Take 1 tablet (5 mg total) by mouth daily at 4 PM. 30 tablet 3   Physical Exam: Right arm/forearm has no swelling. There is ++ swelling of the right hand. His grip strength is decreased because of the swelling but sensory is intact. He has palpable brachial, radial, and ulnar pulses. Both hands are cool to the touch. There is no erythema.  Impression and Plan: I am unsure of the  etiology of his rather acute right hand swelling. I discussed with Dr. Roxy Manns and there is no need for an antibiotic as there is no evidence of phlebitis. Also, patient is on Coumadin (INR to be checked in am) and does not appear to need a duplex for DVT at this time. Patient instructed to elevate right hand/arm and try to keep pressure off the elbow. If swelling worsens or develops pain, he is to contact office asap and will further evaluate. He has an appointment to see PA in clinic for routine follow up on Monday 04/25. I am in the process of scheduling a follow up echo as well.     Nani Skillern, PA-C [facility] [phone]

## 2021-03-11 ENCOUNTER — Other Ambulatory Visit: Payer: Self-pay

## 2021-03-11 ENCOUNTER — Other Ambulatory Visit: Payer: Self-pay | Admitting: Thoracic Surgery (Cardiothoracic Vascular Surgery)

## 2021-03-11 ENCOUNTER — Ambulatory Visit (INDEPENDENT_AMBULATORY_CARE_PROVIDER_SITE_OTHER): Payer: Medicare Other

## 2021-03-11 ENCOUNTER — Telehealth: Payer: Self-pay

## 2021-03-11 DIAGNOSIS — Z9889 Other specified postprocedural states: Secondary | ICD-10-CM | POA: Diagnosis not present

## 2021-03-11 DIAGNOSIS — Z7901 Long term (current) use of anticoagulants: Secondary | ICD-10-CM

## 2021-03-11 DIAGNOSIS — M109 Gout, unspecified: Secondary | ICD-10-CM

## 2021-03-11 LAB — POCT INR: INR: 2.3 (ref 2.0–3.0)

## 2021-03-11 LAB — URIC ACID: Uric Acid, Serum: 6 mg/dL (ref 4.0–8.0)

## 2021-03-11 NOTE — Telephone Encounter (Signed)
-----   Message from Nani Skillern, [place] sent at 03/11/2021 12:54 PM EDT ----- Regarding: RE: requesting labs Unsure of unilateral hand swelling etiology but not unresaonable to get a uric acid level. He will likely need a script to have this drawn. Diagnosis is possible gout.  ----- Message ----- From: Marylen Ponto, LPN Sent: 4/65/6812  11:32 AM EDT To: Rexene Alberts, MD, # Subject: requesting labs                                Mr Heroux called this Am , stating that he does recall that his Dad had a HX of Atypical Gout.  He is thinking that this might be causing his right hand swelling??? And is requesting a Uric- acid level be drawn if you agree. Please advise Linden Dolin

## 2021-03-11 NOTE — Patient Instructions (Signed)
Take 1 tablet Daily.  INR in 1 week.

## 2021-03-11 NOTE — Telephone Encounter (Signed)
Attending Physician's Statement was completed and faxed the [facility] @ [phone]. Beginning leave 02/23/2021 approx. Return to work date 05/11/2021

## 2021-03-11 NOTE — Telephone Encounter (Signed)
An order for a uric acid level check was placed In Epic.

## 2021-03-12 ENCOUNTER — Other Ambulatory Visit: Payer: Self-pay | Admitting: Physician Assistant

## 2021-03-12 ENCOUNTER — Telehealth: Payer: Self-pay

## 2021-03-12 DIAGNOSIS — M109 Gout, unspecified: Secondary | ICD-10-CM

## 2021-03-12 DIAGNOSIS — Z9889 Other specified postprocedural states: Secondary | ICD-10-CM

## 2021-03-12 DIAGNOSIS — R9431 Abnormal electrocardiogram [ECG] [EKG]: Secondary | ICD-10-CM

## 2021-03-12 MED ORDER — COLCHICINE 0.6 MG PO TABS: 0.6000 mg | ORAL_TABLET | Freq: Every day | ORAL | 0 refills | Status: DC

## 2021-03-12 NOTE — Telephone Encounter (Signed)
Dr Roxy Manns recommended short does of Colchicine for elevated uric acid levels RX for med sent to [facility]

## 2021-03-13 ENCOUNTER — Other Ambulatory Visit: Payer: Self-pay | Admitting: Thoracic Surgery (Cardiothoracic Vascular Surgery)

## 2021-03-13 DIAGNOSIS — Z9889 Other specified postprocedural states: Secondary | ICD-10-CM

## 2021-03-16 ENCOUNTER — Ambulatory Visit (INDEPENDENT_AMBULATORY_CARE_PROVIDER_SITE_OTHER): Payer: Self-pay | Admitting: Surgical

## 2021-03-16 ENCOUNTER — Ambulatory Visit
Admission: RE | Admit: 2021-03-16 | Discharge: 2021-03-16 | Disposition: A | Discharge status: Home / Self Care | Payer: Medicare Other | Source: Ambulatory Visit | Attending: Thoracic Surgery (Cardiothoracic Vascular Surgery) | Admitting: Thoracic Surgery (Cardiothoracic Vascular Surgery)

## 2021-03-16 ENCOUNTER — Other Ambulatory Visit: Payer: Self-pay

## 2021-03-16 VITALS — BP 131/82 | HR 84 | Resp 20 | Wt 185.9 lb

## 2021-03-16 DIAGNOSIS — Z9889 Other specified postprocedural states: Secondary | ICD-10-CM

## 2021-03-16 NOTE — Patient Instructions (Signed)
Discussed routine activity progression including driving. 

## 2021-03-16 NOTE — Progress Notes (Signed)
[address]       [place] [address]             [phone]      Mayford M Blakeney Cole Medical Record #[id number] Date of Birth: Dec 21, 1950  Referring: Velna Hatchet, MD Primary Care: Velna Hatchet, MD Primary Cardiologist: Quay Burow, MD   Chief Complaint:   POST OP FOLLOW UP CARDIOTHORACIC SURGERY OPERATIVE NOTE  Date of Procedure:                02/25/2021  Preoperative Diagnosis:      Severe Mitral Regurgitation  Postoperative Diagnosis:    Same  Procedure:        Minimally-Invasive Mitral Valve Repair             Complex valvuloplasty including artificial Gore-tex neochord placement x6             Sorin Memo 4D Ring Annuloplasty (size 76mm, catalog # [id number], serial # [id number])               Surgeon:        Valentina Gu. Roxy Manns, MD  Assistant:       Ellwood Handler, PA-C  Anesthesia:    Roberts Gaudy, MD  Operative Findings: ? Fibroelastic deficiency type myxomatous degenerative disease ? 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 Type II mitral valve dysfunction with severe mitral regurgitation ? Normal left ventricular systolic function ? No residual mitral regurgitation after successful valve repair   History of Present Illness:    Patient is a 70 year old male status post the above described procedure seen in the office on today's date and routine postsurgical follow-up.  Overall he is doing well in his postsurgical recovery.  His only current significant difficulty relates to his right hand and wrist which has become swollen and painful.  He was started on a course of colchicine but has voluntarily stopped this due to his concerns with interaction with amiodarone.  The wrist itself was not erythematous and his uric acid level was 6.0.  He denies significant shortness of breath.  His energy level and ability to tolerate increasing distance with ambulation is steadily improving.  He has had  no difficulty with his incisions.  He has not had fevers, chills or other significant constitutional symptoms.  Overall he is doing well from the surgical procedure itself.      Past Medical History:  Diagnosis Date  . Heart murmur   . Hyperlipidemia   . Hypertension   . Mitral regurgitation   . Mitral valve prolapse   . 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 Trauma      Social History   Tobacco Use  Smoking Status Never Smoker  Smokeless Tobacco Never Used    Social History   Substance and Sexual Activity  Alcohol Use Yes  . Alcohol/week: 5.0 standard drinks  . Types: 5 Cans of beer per week     No Known Allergies  Current Outpatient Medications  Medication Sig Dispense Refill  . amiodarone (PACERONE) 200 MG tablet Take 1 tablet (200 mg total) by mouth 2 (two) times daily. 60 tablet 1  . aspirin 81 MG chewable tablet Chew 81 mg by mouth once. Pre cath    . augmented betamethasone dipropionate (DIPROLENE-AF) 0.05 % cream Apply 1 application topically daily  as needed (irritation).    . Cholecalciferol (VITAMIN D-3) 125 MCG (5000 UT) TABS Take 5,000 Units by mouth in the morning and at bedtime. 30 tablet   . Coenzyme Q10 100 MG TABS Take 100 mg by mouth daily.    . colchicine 0.6 MG tablet Take 1 tablet (0.6 mg total) by mouth daily. 30 tablet 0  . desonide (DESOWEN) 0.05 % ointment Apply 1 application topically daily as needed (irritation).    Marland Kitchen dutasteride (AVODART) 0.5 MG capsule Take 0.5 mg by mouth daily.    . multivitamin (THERAGRAN) per tablet Take 1 tablet by mouth daily.    . Red Yeast Rice Extract (RED YEAST RICE PO) Take 1 tablet by mouth daily at 12 noon.    Marland Kitchen telmisartan-hydrochlorothiazide (MICARDIS HCT) 80-12.5 MG per tablet Take 1 tablet by mouth daily. 30 tablet 11  . traMADol (ULTRAM) 50 MG tablet Take 1 tablet (50 mg total) by mouth every 4 (four) hours  as needed for moderate pain. 30 tablet 0  . warfarin (COUMADIN) 5 MG tablet Take 1 tablet (5 mg total) by mouth daily at 4 PM. 30 tablet 3   No current facility-administered medications for this visit.       Physical Exam: BP 131/82 (BP Location: Left Arm, Patient Position: Sitting, Cuff Size: Normal)   Pulse 84   Resp 20   Wt 185 lb 14.4 oz (84.3 kg)   SpO2 99% Comment: RA  BMI 26.67 kg/m   General appearance: alert, cooperative and no distress Heart: regular rate and rhythm, no rub and No murmur Lungs: clear to auscultation bilaterally Abdomen: Benign exam Extremities: Right hand is swollen without erythema.  There is some mild tenderness to palpation. Wound: INCISIONS HEALINE WELL WITHOUT EVIDENCE OF INFECTION   Diagnostic Studies & Laboratory data:     Recent Radiology Findings:   DG Chest 2 View  Result Date: 03/16/2021 CLINICAL DATA:  2.5 weeks status post minimally invasive mitral valve repair. EXAM: CHEST - 2 VIEW COMPARISON:  March 01, 2021 FINDINGS: Mitral annuloplasty ring. The heart size and mediastinal contours are within normal limits. Bibasilar and right mid lung atelectasis. No pleural effusion. No visible pneumothorax. The visualized skeletal structures are unremarkable. IMPRESSION: Bibasilar and right mid lung atelectasis.  No visible pneumothorax. Electronically Signed   By: Dahlia Bailiff MD   On: 03/16/2021 15:45      Recent Lab Findings: Lab Results  Component Value Date   WBC 13.9 (H) 02/28/2021   HGB 11.0 (L) 02/28/2021   HCT 32.9 (L) 02/28/2021   PLT 143 (L) 02/28/2021   GLUCOSE 103 (H) 03/01/2021   CHOL 181 12/13/2012   TRIG 76.0 12/13/2012   HDL 44.10 12/13/2012   LDLDIRECT 146.9 11/21/2007   LDLCALC 122 (H) 12/13/2012   ALT 29 02/23/2021   AST 37 02/23/2021   NA 134 (L) 03/01/2021   K 3.5 03/01/2021   CL 94 (L) 03/01/2021   CREATININE 1.04 03/01/2021   BUN 17 03/01/2021   CO2 30 03/01/2021   TSH 1.14 12/13/2012   INR 2.3 03/11/2021    HGBA1C 5.8 (H) 02/23/2021      Assessment / Plan: Patient is overall doing well.  There are no significant issues as related to surgery itself.  Chest x-ray was reviewed and there are no significant issues except some mild atelectasis.  It is not clear to me that the right hand issue is gout and he is stopped his colchicine.  He did not have a  appointment with Dr. Burney Gauze with hand surgery but he canceled this.  I encouraged him to maybe make another appointment to see him.  He is currently attempting to manage with changes in diet and nutrition and depending on how that goes he may seek further management.  He is being seen in the Coumadin clinic and INR is therapeutic.  We discussed activity progression.  I did not change any of his current medications at this time.  He will continue to follow-up with cardiology.  We will see him again in 1 month.      Medication Changes: No orders of the defined types were placed in this encounter.     John Giovanni, PA-C 03/16/2021 4:15 PM

## 2021-03-18 ENCOUNTER — Ambulatory Visit (INDEPENDENT_AMBULATORY_CARE_PROVIDER_SITE_OTHER): Payer: Medicare Other

## 2021-03-18 ENCOUNTER — Other Ambulatory Visit: Payer: Self-pay

## 2021-03-18 DIAGNOSIS — Z7901 Long term (current) use of anticoagulants: Secondary | ICD-10-CM

## 2021-03-18 DIAGNOSIS — Z9889 Other specified postprocedural states: Secondary | ICD-10-CM

## 2021-03-18 LAB — POCT INR: INR: 4.2 — AB (ref 2.0–3.0)

## 2021-03-18 NOTE — Progress Notes (Signed)
Cardiology Clinic Note   Patient Name: Ricky Fitzgerald Date of Encounter: 03/24/2021  Primary Care Provider:  Velna Hatchet, MD Primary Cardiologist:  Quay Burow, MD  Patient Profile    Ricky Fitzgerald 70 year old male presents to the clinic today for follow-up evaluation status post minimally invasive mitral valve repair.  Past Medical History    Past Medical History:  Diagnosis Date  . Heart murmur   . Hyperlipidemia   . Hypertension   . Mitral regurgitation   . Mitral valve prolapse   . 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 Trauma    Past Surgical History:  Procedure Laterality Date  . left wrist fx    . MITRAL VALVE REPAIR Right 02/25/2021   Procedure: MINIMALLY INVASIVE MITRAL VALVE REPAIR (MVR) USING MEMO 4D 30MM RING;  Surgeon: Rexene Alberts, MD;  Location: [facility];  Service: Open Heart Surgery;  Laterality: Right;  . PROSTATE SURGERY     biopsy x2  . RIGHT/LEFT HEART CATH AND CORONARY ANGIOGRAPHY N/A 02/19/2021   Procedure: RIGHT/LEFT HEART CATH AND CORONARY ANGIOGRAPHY;  Surgeon: Lorretta Harp, MD;  Location: [facility];  Service: Cardiovascular;  Laterality: N/A;  . rotator cuff sur  1990  . TEE WITHOUT CARDIOVERSION N/A 10/22/2020   Procedure: TRANSESOPHAGEAL ECHOCARDIOGRAM (TEE);  Surgeon: Sanda Klein, MD;  Location: [facility];  Service: Cardiovascular;  Laterality: N/A;  . 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  He was seen by Dr. Gwenlyn Found on 09/23/2020.  During that time he  denied chest pain or shortness of breath.  He reported he was very active riding his bike and skiing in the winter.  He has been initially referred due to cardiac murmur.  Is also noted to have thoracic aortic aneurysm measuring 41 mm.  He was completely asymptomatic at the time.  He presented the clinic 02/13/21 for follow-up evaluation and precardiac catheterization visit.  He stated he continued to be physically active.  He had completed a multiple day ski trip out [place].  He continued to ride his exercise bike at home frequently.  He reported that his murmur was identified incidentally by his PCP during a regular physical.  He had noticed over the last 2 years his exercise tolerance had not been quite as robust.  We discussed the risks and benefits of cardiac catheterization.  He agreed to proceed.  We discussed the expectations surrounding cardiac catheterization.  I  ordered a CBC, CMP, cardiac catheterization, and planned follow-up after his mitral valve repair.  Underwent minimally invasive mitral valve repair by Dr. Roxy Manns on 02/25/2021.  He progressed well.  He was noted to be in atrial fibrillation on 03/01/2021.  He was given an IV amiodarone bolus and started on oral amiodarone 200 mg twice daily.  He continued to tolerate his diet well.  His surgical incisions were clean dry, healing well, and he was discharged in stable condition on 03/01/2021.  He presents to the clinic today for follow-up evaluation states he continues to do  well.  He is increasing his physical activity and walked 2 miles yesterday.  His surgical incision is well approximated clean and dry.  No drainage noted.  He reports that he has a new apple watch and has been noticing occasional episodes of atrial fibrillation.  He reports compliance with his warfarin and denies bleeding issues.  He reports that his right hand pain from his gout is much better.  He is no longer taking colchicine.  He has a follow-up with Dr. Zenia Resides 5/23 and a  follow-up echocardiogram 5/19.  He no longer is taking HCTZ due to gout flare.  We discussed monitoring his abdominal aortic aneurysm and intervals for monitoring.  I will give him the salty 6 diet sheet, have him maintain his physical activity, and follow-up with Dr. Gwenlyn Found in 3-4 months.  Today he denies chest pain, increased shortness of breath, lower extremity edema, fatigue, palpitations, melena, hematuria, hemoptysis, diaphoresis, weakness, presyncope, syncope, orthopnea, and PND.  Home Medications    Prior to Admission medications   Medication Sig Start Date End Date Taking? Authorizing Provider  amiodarone (PACERONE) 200 MG tablet Take 1 tablet (200 mg total) by mouth 2 (two) times daily. 03/02/21   Barrett, Lodema Hong, PA-C  aspirin 81 MG chewable tablet Chew 81 mg by mouth once. Pre cath    [provider]  augmented betamethasone dipropionate (DIPROLENE-AF) 0.05 % cream Apply 1 application topically daily as needed (irritation). 09/03/20   [provider]  Cholecalciferol (VITAMIN D-3) 125 MCG (5000 UT) TABS Take 5,000 Units by mouth in the morning and at bedtime. 03/02/21   Elgie Collard, PA-C  Coenzyme Q10 100 MG TABS Take 100 mg by mouth daily.    [provider]  colchicine 0.6 MG tablet Take 1 tablet (0.6 mg total) by mouth daily. 03/12/21   Rexene Alberts, MD  desonide (DESOWEN) 0.05 % ointment Apply 1 application topically daily as needed (irritation). 09/03/20   [provider]  dutasteride (AVODART) 0.5 MG capsule Take 0.5 mg by mouth daily.    [provider]  multivitamin [facility]) per tablet Take 1 tablet by mouth daily. 03/02/21   Elgie Collard, PA-C  Red Yeast Rice Extract (RED YEAST RICE PO) Take 1 tablet by mouth daily at 12 noon.    [provider]  telmisartan-hydrochlorothiazide (MICARDIS HCT) 80-12.5 MG per tablet Take 1 tablet by mouth daily. 05/28/13   Ricard Dillon, MD  traMADol (ULTRAM) 50 MG tablet Take 1 tablet  (50 mg total) by mouth every 4 (four) hours as needed for moderate pain. 03/02/21   Barrett, Erin R, PA-C  warfarin (COUMADIN) 5 MG tablet Take 1 tablet (5 mg total) by mouth daily at 4 PM. 03/02/21   Barrett, Lodema Hong, PA-C    Family History    Family History  Adopted: Yes   is adopted.   Social History    Social History   Socioeconomic History  . Marital status: Divorced    Spouse name: Not on file  . Number of children: Not on file  . Years of education: Not on file  . Highest education level: Not on file  Occupational History  . Occupation: Surveyor, minerals  Tobacco Use  . Smoking status: Never Smoker  . Smokeless tobacco: Never Used  Vaping Use  . Vaping Use: Never used  Substance and Sexual Activity  . Alcohol use: Yes    Alcohol/week: 5.0 standard drinks    Types: 5 Cans of beer  per week  . Drug use: No  . Sexual activity: Yes  Other Topics Concern  . Not on file  Social History Narrative  . Not on file   Social Determinants of Health   Financial Resource Strain: Not on file  Food Insecurity: Not on file  Transportation Needs: Not on file  Physical Activity: Not on file  Stress: Not on file  Social Connections: Not on file  Intimate Partner Violence: Not on file     Review of Systems    General:  No chills, fever, night sweats or weight changes.  Cardiovascular:  No chest pain, dyspnea on exertion, edema, orthopnea, palpitations, paroxysmal nocturnal dyspnea. Dermatological: No rash, lesions/masses Respiratory: No cough, dyspnea Urologic: No hematuria, dysuria Abdominal:   No nausea, vomiting, diarrhea, bright red blood per rectum, melena, or hematemesis Neurologic:  No visual changes, wkns, changes in mental status. All other systems reviewed and are otherwise negative except as noted above.  Physical Exam    VS:  BP 122/74   Pulse 80   Ht 5\' 10"  (1.778 m)   Wt 192 lb 6.4 oz (87.3 kg)   BMI 27.61 kg/m  , BMI Body mass index is 27.61  kg/m. GEN: Well nourished, well developed, in no acute distress. HEENT: normal. Neck: Supple, no JVD, carotid bruits, or masses. Cardiac: RRR, no murmurs, rubs, or gallops. No clubbing, cyanosis, edema.  Radials/DP/PT 2+ and equal bilaterally.  Respiratory:  Respirations regular and unlabored, clear to auscultation bilaterally. GI: Soft, nontender, nondistended, BS + x 4. MS: no deformity or atrophy. Skin: warm and dry, no rash. Neuro:  Strength and sensation are intact. Psych: Normal affect.  Accessory Clinical Findings    Recent Labs: 02/23/2021: ALT 29 02/28/2021: Hemoglobin 11.0; Magnesium 2.0; Platelets 143 03/01/2021: BUN 17; Creatinine, Ser 1.04; Potassium 3.5; Sodium 134   Recent Lipid Panel    Component Value Date/Time   CHOL 181 12/13/2012 0853   TRIG 76.0 12/13/2012 0853   TRIG 99 11/08/2006 0834   HDL 44.10 12/13/2012 0853   CHOLHDL 4 12/13/2012 0853   VLDL 15.2 12/13/2012 0853   LDLCALC 122 (H) 12/13/2012 0853   LDLDIRECT 146.9 11/21/2007 0942    ECG personally reviewed by me today-none today.  EKG 02/13/2021 sinus bradycardia left anterior fascicular block 59 bpm- No acute changes  Echocardiogram 10/22/2020 IMPRESSIONS    1. Left ventricular ejection fraction, by estimation, is 60 to 65%. The  left ventricle has normal function.  2. Right ventricular systolic function is normal. The right ventricular  size is normal. There is normal pulmonary artery systolic pressure. The  estimated right ventricular systolic pressure is 70.9 mmHg.  3. Left atrial size was mildly dilated. No left atrial/left atrial  appendage thrombus was detected.  4. There is at least one ruptured chord to the medial scallop (P3) of the  posterior leaflet, with subsequent flail motion of the P3 scallop and a  highly eccentric regurgitant jet. Vena contracta 5 mm. Effective  regurgitant orifice area is 0.7 cm, the  regurgitant volume is 90 ml, the regurgitant fraction is 60% (By the  PISA  method). Regurgitant volume is 81 ml and regurgitant fraction is 57% by  the continuity equation.. The mitral valve is myxomatous. Severe mitral  valve regurgitation. No evidence of  mitral stenosis. There is severe holosystolic prolapse of both leaflets of  the mitral valve.  5. The aortic valve is tricuspid. Aortic valve regurgitation is not  visualized. No aortic stenosis is present.  6. There is mild dilatation of the ascending aorta, measuring 41 mm.   Cardiac catheterization 02/19/2021  Mid LAD-1 lesion is 40% stenosed.  Mid LAD-2 lesion is 40% stenosed.  Prox RCA lesion is 40% stenosed.  Hemodynamic findings consistent with mitral valve regurgitation.   Ricky Fitzgerald is a 70 y.o. male    [id number] LOCATION:  FACILITY: [facility]  PHYSICIAN: Quay Burow, M.D. March 10, 1951 Diagnostic Dominance: Left    Intervention    Assessment & Plan   1.  Status post minimally invasive mitral valve repair- continues to heal well.  Slowly increasing his physical activity.  Underwent mitral valve repair 02/25/2021.  Surgical site well approximated clean dry intact no drainage. Continue amiodarone, telmisartan Heart healthy low-sodium diet-salty 6 given Increase physical activity slowly  Postoperative atrial fibrillation- heart rate today 80.  Denies episodes of increased or irregular heartbeat.  Noted to have postoperative atrial fibrillation on postop day 4.  He was given IV amiodarone and transition to p.o. amiodarone.  Denies bleeding issues. Continue amiodarone, warfarin Heart healthy low-sodium diet-salty 6 given Increase physical activity as tolerated  Essential hypertension-BP today 122/74.  Well-controlled at home. Continue telmisartan, HCTZ Heart healthy low-sodium diet-salty 6 given Increase physical activity as tolerated  Disposition: Follow-up with Dr. Gwenlyn Found in 3-4 months.  Ricky Ng. Delancey Moraes NP-C    03/24/2021, 9:54 AM [address] [address] Office [phone] Fax [phone]  Notice: This dictation was prepared with Dragon dictation along with smaller phrase technology. Any transcriptional errors that result from this process are unintentional and may not be corrected upon review.  I spent 15 minutes examining this patient, reviewing medications, and using patient centered shared decision making involving her cardiac care.  Prior to her visit I spent greater than 20 minutes reviewing her past medical history,  medications, and prior cardiac tests.

## 2021-03-18 NOTE — Patient Instructions (Signed)
Hold tonight only and then continue taking 1 tablet Daily.  INR in 1 week.

## 2021-03-24 ENCOUNTER — Ambulatory Visit (INDEPENDENT_AMBULATORY_CARE_PROVIDER_SITE_OTHER): Payer: Medicare Other | Admitting: General Practice

## 2021-03-24 ENCOUNTER — Encounter: Payer: Self-pay | Admitting: General Practice

## 2021-03-24 ENCOUNTER — Telehealth: Payer: Self-pay | Admitting: General Practice

## 2021-03-24 ENCOUNTER — Ambulatory Visit (INDEPENDENT_AMBULATORY_CARE_PROVIDER_SITE_OTHER): Payer: Medicare Other | Admitting: Pharmacist Clinician (PhC)/ Clinical Pharmacy Specialist

## 2021-03-24 ENCOUNTER — Other Ambulatory Visit: Payer: Self-pay

## 2021-03-24 VITALS — BP 122/74 | HR 80 | Ht 70.0 in | Wt 192.4 lb

## 2021-03-24 DIAGNOSIS — Z9889 Other specified postprocedural states: Secondary | ICD-10-CM

## 2021-03-24 DIAGNOSIS — I10 Essential (primary) hypertension: Secondary | ICD-10-CM | POA: Diagnosis not present

## 2021-03-24 DIAGNOSIS — Z01810 Encounter for preprocedural cardiovascular examination: Secondary | ICD-10-CM | POA: Diagnosis not present

## 2021-03-24 DIAGNOSIS — I4891 Unspecified atrial fibrillation: Secondary | ICD-10-CM

## 2021-03-24 DIAGNOSIS — Z7901 Long term (current) use of anticoagulants: Secondary | ICD-10-CM

## 2021-03-24 LAB — POCT INR: INR: 4.2 — AB (ref 2.0–3.0)

## 2021-03-24 MED ORDER — TELMISARTAN 80 MG PO TABS: 80.0000 mg | ORAL_TABLET | Freq: Every day | ORAL | 12 refills | Status: AC

## 2021-03-24 NOTE — Patient Instructions (Signed)
Medication Instructions:  The current medical regimen is effective;  continue present plan and medications as directed. Please refer to the Current Medication list given to you today. *If you need a refill on your cardiac medications before your next appointment, please call your pharmacy*  Lab Work:   Testing/Procedures:  NONE    NONE  Special Instructions PLEASE READ AND FOLLOW SALTY 6-ATTACHED-1,800mg  daily  PLEASE MAINTAIN PHYSICAL ACTIVITY AS TOLERATED  Follow-Up: Your next appointment:  4 month(s) In Person with Quay Burow, MD ONLY  At [facility], you and your health needs are our priority.  As part of our continuing mission to provide you with exceptional heart care, we have created designated Provider Care Teams.  These Care Teams include your primary Cardiologist (physician) and Advanced Practice Providers (APPs -  Physician Assistants and Nurse Practitioners) who all work together to provide you with the care you need, when you need it.            6 SALTY THINGS TO AVOID     1,800MG  DAILY

## 2021-03-24 NOTE — Telephone Encounter (Signed)
[facility] received forms from [employer], forms were given to provider on 5/3.

## 2021-03-24 NOTE — Telephone Encounter (Signed)
Forms received back from provider, lmtcb patient needs to sign release, and pay $29 forms fee (cash, check, money order). Patient can come to [facility] office to take care of this.

## 2021-03-29 ENCOUNTER — Encounter: Payer: Self-pay | Admitting: Thoracic Surgery (Cardiothoracic Vascular Surgery)

## 2021-04-08 ENCOUNTER — Other Ambulatory Visit: Payer: Self-pay

## 2021-04-08 ENCOUNTER — Ambulatory Visit (INDEPENDENT_AMBULATORY_CARE_PROVIDER_SITE_OTHER): Payer: Medicare Other

## 2021-04-08 DIAGNOSIS — Z7901 Long term (current) use of anticoagulants: Secondary | ICD-10-CM | POA: Diagnosis not present

## 2021-04-08 DIAGNOSIS — Z9889 Other specified postprocedural states: Secondary | ICD-10-CM

## 2021-04-08 LAB — POCT INR: INR: 4.3 — AB (ref 2.0–3.0)

## 2021-04-08 NOTE — Patient Instructions (Signed)
Hold Warfarin then decrease to 1 tablet Daily, except 0.5 tablet on Monday and Friday.  INR in 4 weeks.

## 2021-04-09 ENCOUNTER — Ambulatory Visit (HOSPITAL_COMMUNITY): Payer: Medicare Other | Attending: Cardiology

## 2021-04-09 DIAGNOSIS — Z954 Presence of other heart-valve replacement: Secondary | ICD-10-CM

## 2021-04-09 DIAGNOSIS — R9431 Abnormal electrocardiogram [ECG] [EKG]: Secondary | ICD-10-CM | POA: Diagnosis present

## 2021-04-09 DIAGNOSIS — Z9889 Other specified postprocedural states: Secondary | ICD-10-CM | POA: Diagnosis present

## 2021-04-13 ENCOUNTER — Encounter: Payer: Medicare Other | Admitting: Thoracic Surgery (Cardiothoracic Vascular Surgery)

## 2021-04-14 ENCOUNTER — Encounter: Payer: Self-pay | Admitting: Thoracic Surgery (Cardiothoracic Vascular Surgery)

## 2021-04-14 ENCOUNTER — Other Ambulatory Visit: Payer: Self-pay

## 2021-04-14 ENCOUNTER — Other Ambulatory Visit: Payer: Self-pay | Admitting: Thoracic Surgery (Cardiothoracic Vascular Surgery)

## 2021-04-14 ENCOUNTER — Ambulatory Visit
Admission: RE | Admit: 2021-04-14 | Discharge: 2021-04-14 | Disposition: A | Discharge status: Home / Self Care | Payer: Medicare Other | Source: Ambulatory Visit | Attending: Thoracic Surgery (Cardiothoracic Vascular Surgery) | Admitting: Thoracic Surgery (Cardiothoracic Vascular Surgery)

## 2021-04-14 ENCOUNTER — Ambulatory Visit (INDEPENDENT_AMBULATORY_CARE_PROVIDER_SITE_OTHER): Payer: Self-pay | Admitting: Thoracic Surgery (Cardiothoracic Vascular Surgery)

## 2021-04-14 VITALS — BP 137/77 | HR 58 | Resp 20 | Ht 70.0 in | Wt 193.0 lb

## 2021-04-14 DIAGNOSIS — Z9889 Other specified postprocedural states: Secondary | ICD-10-CM

## 2021-04-14 NOTE — Progress Notes (Signed)
[address]       [place] [address]             [phone]     CARDIOTHORACIC SURGERY OFFICE NOTE  Referring Provider is Lorretta Harp, MD PCP is Velna Hatchet, MD   HPI:  Patient is 70 year old male who returns to the office today for routine follow-up status post minimally invasive mitral valve repair for mitral valve prolapse with stage C and possibly early stage D severe primary mitral regurgitation on February 25, 2021.  The patient's early postoperative recovery was uncomplicated although he did have a brief episode of atrial fibrillation while he remained in the hospital.  He was discharged home in sinus rhythm on oral amiodarone and warfarin anticoagulation.  Following hospital discharge she developed swelling in his right hand related to a flare of gout which resolved with colchicine therapy.  He was last seen here in our office on March 16, 2021 at which time he was doing fairly well.  Since then he has continued to do very well clinically.  His prothrombin time has been followed in the anticoagulation clinic where it was last checked on Apr 08, 2021.  He was supratherapeutic at the time and his Coumadin dose was adjusted.  He underwent routine follow-up transthoracic echocardiogram on Apr 09, 2021.  This echo has not yet been read by a cardiologist.  He returns her office today and reports that he is doing very well.  He has had no significant pain in his chest and he has not required any sort of pain relievers.  Activity level is quite good and he has been riding his stationary bicycle.  He states that now he cannot even get his heart rate up to 90 while he is riding his stationary bicycle at a fairly brisk pace.  He has no exertional shortness of breath.  He mowed his lawn this past weekend and stated that he did get tired but overall he felt fine.  He has bought himself a smart watch that monitors his rhythm.  He states that for the most part it tells him he  has remained in sinus rhythm but is unsure whether or not he has had any definite episodes of atrial fibrillation.  He has not had any palpitations or other signs to suggest a recurrence of atrial fibrillation over the past 2 weeks.  Appetite is good.  Overall he is very pleased with his outcome.   Current Outpatient Medications  Medication Sig Dispense Refill  . amiodarone (PACERONE) 200 MG tablet Take 1 tablet (200 mg total) by mouth 2 (two) times daily. 60 tablet 1  . augmented betamethasone dipropionate (DIPROLENE-AF) 0.05 % cream Apply 1 application topically daily as needed (irritation).    . Cholecalciferol (VITAMIN D-3) 125 MCG (5000 UT) TABS Take 5,000 Units by mouth in the morning and at bedtime. 30 tablet   . Coenzyme Q10 100 MG TABS Take 100 mg by mouth daily.    . colchicine 0.6 MG tablet Take 1 tablet (0.6 mg total) by mouth daily. 30 tablet 0  . desonide (DESOWEN) 0.05 % ointment Apply 1 application topically daily as needed (irritation).    Marland Kitchen dutasteride (AVODART) 0.5 MG capsule Take 0.5 mg by mouth daily.    . multivitamin (THERAGRAN) per tablet Take 1 tablet by mouth daily.    . Red Yeast Rice Extract (RED YEAST RICE PO) Take 1 tablet by mouth daily at 12 noon.    Marland Kitchen  telmisartan (MICARDIS) 80 MG tablet Take 1 tablet (80 mg total) by mouth daily. 30 tablet 12  . traMADol (ULTRAM) 50 MG tablet Take 1 tablet (50 mg total) by mouth every 4 (four) hours as needed for moderate pain. 30 tablet 0  . warfarin (COUMADIN) 5 MG tablet Take 1 tablet (5 mg total) by mouth daily at 4 PM. 30 tablet 3   No current facility-administered medications for this visit.      Physical Exam:   BP 137/77 (BP Location: Left Arm, Patient Position: Sitting, Cuff Size: Large)   Pulse (!) 58   Resp 20   Ht 5\' 10"  (1.778 m)   Wt 193 lb (87.5 kg)   SpO2 100% Comment: RA  BMI 27.69 kg/m   General:  Well-appearing  Chest:   Clear to auscultation  CV:   Regular rate and rhythm without  murmur  Incisions:  Well-healed  Abdomen:  Soft nontender  Extremities:  Warm and well-perfused  Diagnostic Tests:  TRANSTHORACIC ECHOCARDIOGRAM  Images from transthoracic echocardiogram performed Apr 09, 2021 are reviewed.  This examination has not yet been read by a cardiologist.  To my exam left ventricular size and systolic function appears normal.  Mitral valve repair is intact.  There is no evidence of any residual mitral regurgitation.  On 2 different measurements mean transvalvular gradient was measured 3 and 4 mmHg, respectively.  No other abnormalities were noted.   CHEST - 2 VIEW  COMPARISON:  03/16/2021  FINDINGS: The heart size and mediastinal contours are within normal limits. Minimal, bandlike scarring or atelectasis of the right midlung. No acute appearing airspace opacity. Disc degenerative disease of the thoracic spine.  IMPRESSION: Minimal, bandlike scarring or atelectasis of the right midlung. No acute appearing airspace opacity.   Electronically Signed   By: Eddie Candle M.D.   On: 04/14/2021 15:36     Impression:  Patient is doing very well approximately 6 weeks following minimally invasive mitral valve repair  Plan:  I have instructed the patient that he may gradually increase his physical activity without any particular limitations at this time.  I have also instructed the patient to decrease his dose of amiodarone to 200 mg daily.  I would recommend staying on low-dose amiodarone for probably another 3 or 4 weeks at which time probably could be stopped as long as he continues to maintain sinus rhythm.  Once she has been off amiodarone for at least 3 or 4 weeks it might be reasonable to stop taking warfarin.  However, we will defer this decision to Dr. Gwenlyn Found.  The patient has been reminded regarding the importance of dental hygiene and the lifelong need for antibiotic prophylaxis for all dental cleanings and other related invasive procedures.  The  patient will continue to follow-up intermittently with Dr. Gwenlyn Found.  He will call and asked to return to this office in the future only should specific problems or questions arise.     Valentina Gu. Roxy Manns, MD 04/14/2021 3:30 PM

## 2021-04-14 NOTE — Patient Instructions (Addendum)
Decrease amiodarone to one tablet (200 mg) by mouth daily until your current prescription runs out then stop taking it altogether.  Let the pharmacist in the anticoagulation clinic know when you stop taking amiodarone.  Continue all other previous medications without any changes at this time.  It would be reasonable to stop taking Coumadin in approximately 8 weeks if you continue to do well and do not have any irregular heart rhythms after you have stopped taking amiodarone.  You may continue to gradually increase your physical activity as tolerated.  Refrain from any heavy lifting or strenuous use of your arms and shoulders until at least 8 weeks from the time of your surgery, and avoid activities that cause increased pain in your chest on the [place] of your surgical incision.  Otherwise you may continue to increase activities without any particular limitations.  Increase the intensity and duration of physical activity gradually.  You may return to driving an automobile as long as you are no longer requiring oral narcotic pain relievers during the daytime.  It would be wise to start driving only short distances during the daylight and gradually increase from there as you feel comfortable.  Endocarditis is a potentially serious infection of heart valves or inside lining of the heart.  It occurs more commonly in patients with diseased heart valves (such as patient's with aortic or mitral valve disease) and in patients who have undergone heart valve repair or replacement.  Certain surgical and dental procedures may put you at risk, such as dental cleaning, other dental procedures, or any surgery involving the respiratory, urinary, gastrointestinal tract, gallbladder or prostate gland.   To minimize your chances for develooping endocarditis, maintain good oral health and seek prompt medical attention for any infections involving the mouth, teeth, gums, skin or urinary tract.    Always notify your doctor or  dentist about your underlying heart valve condition before having any invasive procedures. You will need to take antibiotics before certain procedures, including all routine dental cleanings or other dental procedures.  Your cardiologist or dentist should prescribe these antibiotics for you to be taken ahead of time.

## 2021-04-15 LAB — ECHOCARDIOGRAM COMPLETE
Area-P 1/2: 2.17 cm2
S' Lateral: 2.4 cm

## 2021-05-04 ENCOUNTER — Encounter: Payer: Self-pay | Admitting: *Deleted

## 2021-05-06 ENCOUNTER — Other Ambulatory Visit: Payer: Self-pay

## 2021-05-06 ENCOUNTER — Ambulatory Visit (INDEPENDENT_AMBULATORY_CARE_PROVIDER_SITE_OTHER): Payer: Medicare Other

## 2021-05-06 DIAGNOSIS — Z7901 Long term (current) use of anticoagulants: Secondary | ICD-10-CM | POA: Diagnosis not present

## 2021-05-06 DIAGNOSIS — Z9889 Other specified postprocedural states: Secondary | ICD-10-CM | POA: Diagnosis not present

## 2021-05-06 LAB — POCT INR: INR: 2.2 (ref 2.0–3.0)

## 2021-05-06 NOTE — Patient Instructions (Signed)
Continue taking 1 tablet Daily, except 0.5 tablet on Monday and Friday.  INR in 6 weeks.

## 2021-06-17 ENCOUNTER — Ambulatory Visit (INDEPENDENT_AMBULATORY_CARE_PROVIDER_SITE_OTHER): Payer: Medicare Other

## 2021-06-17 ENCOUNTER — Other Ambulatory Visit: Payer: Self-pay

## 2021-06-17 DIAGNOSIS — Z7901 Long term (current) use of anticoagulants: Secondary | ICD-10-CM | POA: Diagnosis not present

## 2021-06-17 DIAGNOSIS — Z9889 Other specified postprocedural states: Secondary | ICD-10-CM

## 2021-06-17 LAB — POCT INR: INR: 3.1 — AB (ref 2.0–3.0)

## 2021-06-17 NOTE — Patient Instructions (Signed)
Continue taking 1 tablet Daily, except 0.5 tablet on Monday and Friday.  INR in 6 weeks.

## 2021-07-10 ENCOUNTER — Other Ambulatory Visit: Payer: Self-pay | Admitting: Physician Assistant

## 2021-07-10 ENCOUNTER — Other Ambulatory Visit: Payer: Self-pay | Admitting: Cardiovascular Disease

## 2021-07-15 ENCOUNTER — Other Ambulatory Visit: Payer: Self-pay

## 2021-07-15 ENCOUNTER — Ambulatory Visit (INDEPENDENT_AMBULATORY_CARE_PROVIDER_SITE_OTHER): Payer: Medicare Other | Admitting: Pharmacist

## 2021-07-15 ENCOUNTER — Encounter: Payer: Self-pay | Admitting: Cardiovascular Disease

## 2021-07-15 ENCOUNTER — Ambulatory Visit (INDEPENDENT_AMBULATORY_CARE_PROVIDER_SITE_OTHER): Payer: Medicare Other

## 2021-07-15 ENCOUNTER — Ambulatory Visit (INDEPENDENT_AMBULATORY_CARE_PROVIDER_SITE_OTHER): Payer: Medicare Other | Admitting: Cardiovascular Disease

## 2021-07-15 DIAGNOSIS — E782 Mixed hyperlipidemia: Secondary | ICD-10-CM | POA: Diagnosis not present

## 2021-07-15 DIAGNOSIS — I340 Nonrheumatic mitral (valve) insufficiency: Secondary | ICD-10-CM | POA: Diagnosis not present

## 2021-07-15 DIAGNOSIS — Z9889 Other specified postprocedural states: Secondary | ICD-10-CM

## 2021-07-15 DIAGNOSIS — Z7901 Long term (current) use of anticoagulants: Secondary | ICD-10-CM

## 2021-07-15 DIAGNOSIS — I480 Paroxysmal atrial fibrillation: Secondary | ICD-10-CM

## 2021-07-15 DIAGNOSIS — I4891 Unspecified atrial fibrillation: Secondary | ICD-10-CM

## 2021-07-15 LAB — LIPID PANEL
Chol/HDL Ratio: 4.5 ratio (ref 0.0–5.0)
Cholesterol, Total: 204 mg/dL — ABNORMAL HIGH (ref 100–199)
HDL: 45 mg/dL (ref >39)
LDL Chol Calc (NIH): 136 mg/dL — ABNORMAL HIGH (ref 0–99)
Triglycerides: 129 mg/dL (ref 0–149)
VLDL Cholesterol Cal: 23 mg/dL (ref 5–40)

## 2021-07-15 LAB — POCT INR: INR: 2.2 (ref 2.0–3.0)

## 2021-07-15 MED ORDER — ATORVASTATIN CALCIUM 10 MG PO TABS: 10.0000 mg | ORAL_TABLET | Freq: Every day | ORAL | 3 refills | Status: DC

## 2021-07-15 NOTE — Patient Instructions (Signed)
Medication Instructions:  Start Atorvastatin 10 mg daily   *If you need a refill on your cardiac medications before your next appointment, please call your pharmacy*   Lab Work: LIPID today if fasting.  LIDID/LIVER in 3 months (fasting, nothing to eat or drink, no appointment needed)  If you have labs (blood work) drawn today and your tests are completely normal, you will receive your results only by: [place] (if you have MyChart) OR A paper copy in the mail If you have any lab test that is abnormal or we need to change your treatment, we will call you to review the results.   Testing/Procedures: Echocardiogram (IN MAY 2023) - Your physician has requested that you have an echocardiogram. Echocardiography is a painless test that uses sound waves to create images of your heart. It provides your doctor with information about the size and shape of your heart and how well your heart's chambers and valves are working. This procedure takes approximately one hour. There are no restrictions for this procedure. This will be performed at our [facility] location - [address], [address].  ZIO XT- Long Term Monitor Instructions  Your physician has requested you wear a ZIO patch monitor for 14 days.  This is a single patch monitor. [employer] supplies one patch monitor per enrollment. Additional stickers are not available. Please do not apply patch if you will be having a Nuclear Stress Test,  Echocardiogram, Cardiac CT, MRI, or Chest Xray during the period you would be wearing the  monitor. The patch cannot be worn during these tests. You cannot remove and re-apply the  ZIO XT patch monitor.  Your ZIO patch monitor will be mailed 3 day USPS to your address on file. It may take 3-5 days  to receive your monitor after you have been enrolled.  Once you have received your monitor, please review the enclosed instructions. Your monitor  has already been registered assigning a specific monitor  serial # to you.  Billing and Patient Assistance Program Information  We have supplied [employer] with any of your insurance information on file for billing purposes. [employer] offers a sliding scale Patient Assistance Program for patients that do not have  insurance, or whose insurance does not completely cover the cost of the ZIO monitor.  You must apply for the Patient Assistance Program to qualify for this discounted rate.  To apply, please call [employer] at [phone], select option 4, select option 2, ask to apply for  Patient Assistance Program. Theodore Demark will ask your household income, and how many people  are in your household. They will quote your out-of-pocket cost based on that information.  [employer] will also be able to set up a 102-month interest-free payment plan if needed.  Applying the monitor   Shave hair from upper left chest.  Hold abrader disc by orange tab. Rub abrader in 40 strokes over the upper left chest as  indicated in your monitor instructions.  Clean area with 4 enclosed alcohol pads. Let dry.  Apply patch as indicated in monitor instructions. Patch will be placed under collarbone on left  Detric of chest with arrow pointing upward.  Rub patch adhesive wings for 2 minutes. Remove white label marked "1". Remove the white  label marked "2". Rub patch adhesive wings for 2 additional minutes.  While looking in a mirror, press and release button in center of patch. A small green light will  flash 3-4 times. This will be your only indicator  that the monitor has been turned on.  Do not shower for the first 24 hours. You may shower after the first 24 hours.  Press the button if you feel a symptom. You will hear a small click. Record Date, Time and  Symptom in the Patient Logbook.  When you are ready to remove the patch, follow instructions on the last 2 pages of Patient  Logbook. Stick patch monitor onto the last page of Patient Logbook.  Place Patient Logbook in the blue  and white box. Use locking tab on box and tape box closed  securely. The blue and white box has prepaid postage on it. Please place it in the mailbox as  soon as possible. Your physician should have your test results approximately 7 days after the  monitor has been mailed back to [facility].  Call Joy at [phone] if you have questions regarding  your ZIO XT patch monitor. Call them immediately if you see an orange light blinking on your  monitor.  If your monitor falls off in less than 4 days, contact our Monitor department at [phone].  If your monitor becomes loose or falls off after 4 days call [employer] at [phone] for  suggestions on securing your monitor   Follow-Up: At [facility], you and your health needs are our priority.  As part of our continuing mission to provide you with exceptional heart care, we have created designated Provider Care Teams.  These Care Teams include your primary Cardiologist (physician) and Advanced Practice Providers (APPs -  Physician Assistants and Nurse Practitioners) who all work together to provide you with the care you need, when you need it.  We recommend signing up for the patient portal called "MyChart".  Sign up information is provided on this After Visit Summary.  MyChart is used to connect with patients for Virtual Visits (Telemedicine).  Patients are able to view lab/test results, encounter notes, upcoming appointments, etc.  Non-urgent messages can be sent to your provider as well.   To learn more about what you can do with MyChart, go to [email or web address].    Your next appointment:   6 month(s)  The format for your next appointment:   In Person  Provider:   Quay Burow, MD

## 2021-07-15 NOTE — Progress Notes (Signed)
07/15/2021 Ricky Fitzgerald   11/26/1950  [id number]  Primary Physician Ricky Hatchet, MD Primary Cardiologist: Ricky Harp MD Ricky Fitzgerald, [place]  HPI:  Ricky Fitzgerald is a 70 y.o.  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 Ardeth Fitzgerald, for evaluation of severe mitral vegetation.  I last saw him in the office 09/23/2020.  His only risk factor for cardiovascular disease is treated hypertension.  There is no family history for heart disease.  Never had an attack or stroke.  He denies chest pain or shortness of breath.  He is very active and rides bikes and skis in the winter.  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  He is completely asymptomatic.  I performed coronary angiography on him 02/19/2021 revealing minimal nonobstructive disease in his LAD, elevated V waves on right heart cath consistent with his severe MR.  He underwent minimal invasive mitral valve repair by Dr. Roxy Fitzgerald on 02/25/2021 with a Sorin memo 4D ring annuloplasty (30 mm in size) and artificial Gore-Tex neo  cord placement x6.  His follow-up 2D echo performed 04/06/2021 revealed well-placed prosthetic annuloplasty ring with no MR.  He feels clinically improved.  He is back to riding his bicycle.  His amiodarone was discontinued several weeks ago although he continues on warfarin for his PAF.     Current Meds  Medication Sig   augmented betamethasone dipropionate (DIPROLENE-AF) 0.05 % cream Apply 1 application topically daily as needed (irritation).   Cholecalciferol (VITAMIN D-3) 125 MCG (5000 UT) TABS Take 5,000 Units by mouth in the morning and at bedtime.   Coenzyme Q10 100 MG TABS Take 100 mg  by mouth daily.   colchicine 0.6 MG tablet Take 1 tablet (0.6 mg total) by mouth daily.   desonide (DESOWEN) 0.05 % ointment Apply 1 application topically daily as needed (irritation).   dutasteride (AVODART) 0.5 MG capsule Take 0.5 mg by mouth daily.   multivitamin (THERAGRAN) per tablet Take 1 tablet by mouth daily.   Red Yeast Rice Extract (RED YEAST RICE PO) Take 1 tablet by mouth daily at 12 noon.   telmisartan (MICARDIS) 80 MG tablet Take 1 tablet (80 mg total) by mouth daily.   warfarin (COUMADIN) 5 MG tablet Take 1 tablet (5 mg total) by mouth daily at 4 PM.     No Known Allergies  Social History   Socioeconomic History   Marital status: Divorced    Spouse name: Not on file   Number of children: Not on file   Years of education: Not on file   Highest education level: Not on file  Occupational History   Occupation: physicians assistant  Tobacco Use   Smoking status: Never   Smokeless tobacco: Never  Vaping Use   Vaping Use: Never used  Substance and Sexual Activity   Alcohol use: Yes    Alcohol/week: 5.0 standard drinks    Types: 5 Cans of beer per week   Drug use: No   Sexual activity: Yes  Other Topics Concern   Not on file  Social History Narrative   ** Merged History Encounter **  Social Determinants of Health   Financial Resource Strain: Not on file  Food Insecurity: Not on file  Transportation Needs: Not on file  Physical Activity: Not on file  Stress: Not on file  Social Connections: Not on file  Intimate Partner Violence: Not on file     Review of Systems: General: negative for chills, fever, night sweats or weight changes.  Cardiovascular: negative for chest pain, dyspnea on exertion, edema, orthopnea, palpitations, paroxysmal nocturnal dyspnea or shortness of breath Dermatological: negative for rash Respiratory: negative for cough or wheezing Urologic: negative for hematuria Abdominal: negative for nausea, vomiting, diarrhea, bright red  blood per rectum, melena, or hematemesis Neurologic: negative for visual changes, syncope, or dizziness All other systems reviewed and are otherwise negative except as noted above.    Blood pressure 140/84, pulse (!) 57, height '5\' 10"'$  (1.778 m), weight 194 lb 12.8 oz (88.4 kg), SpO2 96 %.  General appearance: alert and no distress Neck: no adenopathy, no carotid bruit, no JVD, supple, symmetrical, trachea midline, and thyroid not enlarged, symmetric, no tenderness/mass/nodules Lungs: clear to auscultation bilaterally Heart: regular rate and rhythm, S1, S2 normal, no murmur, click, rub or gallop Extremities: extremities normal, atraumatic, no cyanosis or edema Pulses: 2+ and symmetric Skin: Skin color, texture, turgor normal. No rashes or lesions Neurologic: Grossly normal  EKG not performed today  ASSESSMENT AND PLAN:   Hyperlipidemia .History of hyperlipidemia on red yeast rice his most recent lipid profile performed 07/01/2020 revealed total cholesterol of 182, LDL of 116 and HDL 46.  He did have 40% stenoses in his proximal and mid LAD.  Based on this I am going to begin him on atorvastatin 10 mg a day and we will recheck a lipid liver profile in 3 months.  Goal is to achieve a LDL of less than 70 for secondary prevention.  Severe mitral regurgitation History of severe mitral regurgitation status post minimally invasive mitral valve repair by Dr. Roxy Fitzgerald 02/25/2021.  He was discharged home 5 days later.  I did cath him to prior month (02/19/2021 revealing minimal nonobstructive CAD with elevated V wave of 27.  He has had some PAF and was on amiodarone until several weeks ago.  He has continued warfarin.  He is back to his preprocedure activity level, actively riding his bicycle.  We will recheck a 2D echo in 1 year.  His first postop echo performed 04/09/2021 revealed normal LV systolic function with a well-functioning mitral annuloplasty ring and mitral valve repair without evidence of  MR.  Atrial fibrillation ([place]) History of PAF on warfarin anticoagulation with an INR of 2.2 maintaining sinus rhythm.  I Minna check a 2-week Zio patch.  If he has no evidence of PAF I will stop his warfarin.     Ricky Harp MD FACP,FACC,FAHA, [facility] 07/15/2021 10:09 AM

## 2021-07-15 NOTE — Assessment & Plan Note (Signed)
History of severe mitral regurgitation status post minimally invasive mitral valve repair by Dr. Roxy Manns 02/25/2021.  He was discharged home 5 days later.  I did cath him to prior month (02/19/2021 revealing minimal nonobstructive CAD with elevated V wave of 27.  He has had some PAF and was on amiodarone until several weeks ago.  He has continued warfarin.  He is back to his preprocedure activity level, actively riding his bicycle.  We will recheck a 2D echo in 1 year.  His first postop echo performed 04/09/2021 revealed normal LV systolic function with a well-functioning mitral annuloplasty ring and mitral valve repair without evidence of MR.

## 2021-07-15 NOTE — Patient Instructions (Signed)
Description   Continue taking 1 tablet Daily, except 0.5 tablets on Monday and Friday.  INR in 6 weeks.

## 2021-07-15 NOTE — Assessment & Plan Note (Signed)
History of PAF on warfarin anticoagulation with an INR of 2.2 maintaining sinus rhythm.  I Minna check a 2-week Zio patch.  If he has no evidence of PAF I will stop his warfarin.

## 2021-07-15 NOTE — Progress Notes (Unsigned)
Enrolled patient for a 14 day Zio XT  monitor to be mailed to patients home  °

## 2021-07-15 NOTE — Assessment & Plan Note (Signed)
.  History of hyperlipidemia on red yeast rice his most recent lipid profile performed 07/01/2020 revealed total cholesterol of 182, LDL of 116 and HDL 46.  He did have 40% stenoses in his proximal and mid LAD.  Based on this I am going to begin him on atorvastatin 10 mg a day and we will recheck a lipid liver profile in 3 months.  Goal is to achieve a LDL of less than 70 for secondary prevention.

## 2021-07-21 DIAGNOSIS — I480 Paroxysmal atrial fibrillation: Secondary | ICD-10-CM

## 2021-07-25 ENCOUNTER — Other Ambulatory Visit: Payer: Self-pay | Admitting: Physician Assistant

## 2021-07-28 ENCOUNTER — Other Ambulatory Visit: Payer: Self-pay | Admitting: Cardiovascular Disease

## 2021-07-28 ENCOUNTER — Telehealth: Payer: Self-pay | Admitting: Cardiovascular Disease

## 2021-07-28 NOTE — Telephone Encounter (Signed)
*  STAT* If patient is at the pharmacy, call can be transferred to refill team.   1. Which medications need to be refilled? (please list name of each medication and dose if known) warfarin (COUMADIN) 5 MG tablet  2. Which pharmacy/location (including street and city if local pharmacy) is medication to be sent to? [place], [place] [address]  3. Do they need a 30 day or 90 day supply? [place]

## 2021-07-29 MED ORDER — WARFARIN SODIUM 5 MG PO TABS: 5.0000 mg | ORAL_TABLET | Freq: Every day | ORAL | 0 refills | Status: DC

## 2021-07-29 NOTE — Telephone Encounter (Signed)
Called and spoke w/pt and stated that we sent refill, apologized for the delay, and gave him our direct contact number and they voiced gratitude and understanding

## 2021-07-29 NOTE — Telephone Encounter (Signed)
Patient is following up. He states he is completely out of medication and the pharmacy is not distributing the refills. Please assist and contact patient with confirmation.

## 2021-07-31 ENCOUNTER — Ambulatory Visit: Payer: Medicare Other | Admitting: Cardiovascular Disease

## 2021-08-21 ENCOUNTER — Telehealth: Payer: Self-pay

## 2021-08-21 NOTE — Telephone Encounter (Signed)
Spoke with pt regarding instructions per Dr. Gwenlyn Found that pt can discontinue coumadin. Medication list changed. Pt verbalizes understanding. Coumadin clinic appointment cancelled.

## 2022-03-22 ENCOUNTER — Ambulatory Visit (HOSPITAL_COMMUNITY): Payer: Medicare Other | Attending: Internal Medicine

## 2022-03-22 DIAGNOSIS — I340 Nonrheumatic mitral (valve) insufficiency: Secondary | ICD-10-CM | POA: Diagnosis present

## 2022-03-22 LAB — ECHOCARDIOGRAM COMPLETE
Area-P 1/2: 2.69 cm2
MV VTI: 1.64 cm2
S' Lateral: 3 cm

## 2022-04-04 IMAGING — CR DG CHEST 2V
2 series · 2 of 2 positions shown · non-contrast
Comparison: CT chest 11/26/2020

CLINICAL DATA: 69-year-old male with a history preop study for
cardiac surge

EXAM:
CHEST - 2 VIEW

[w chest pa]
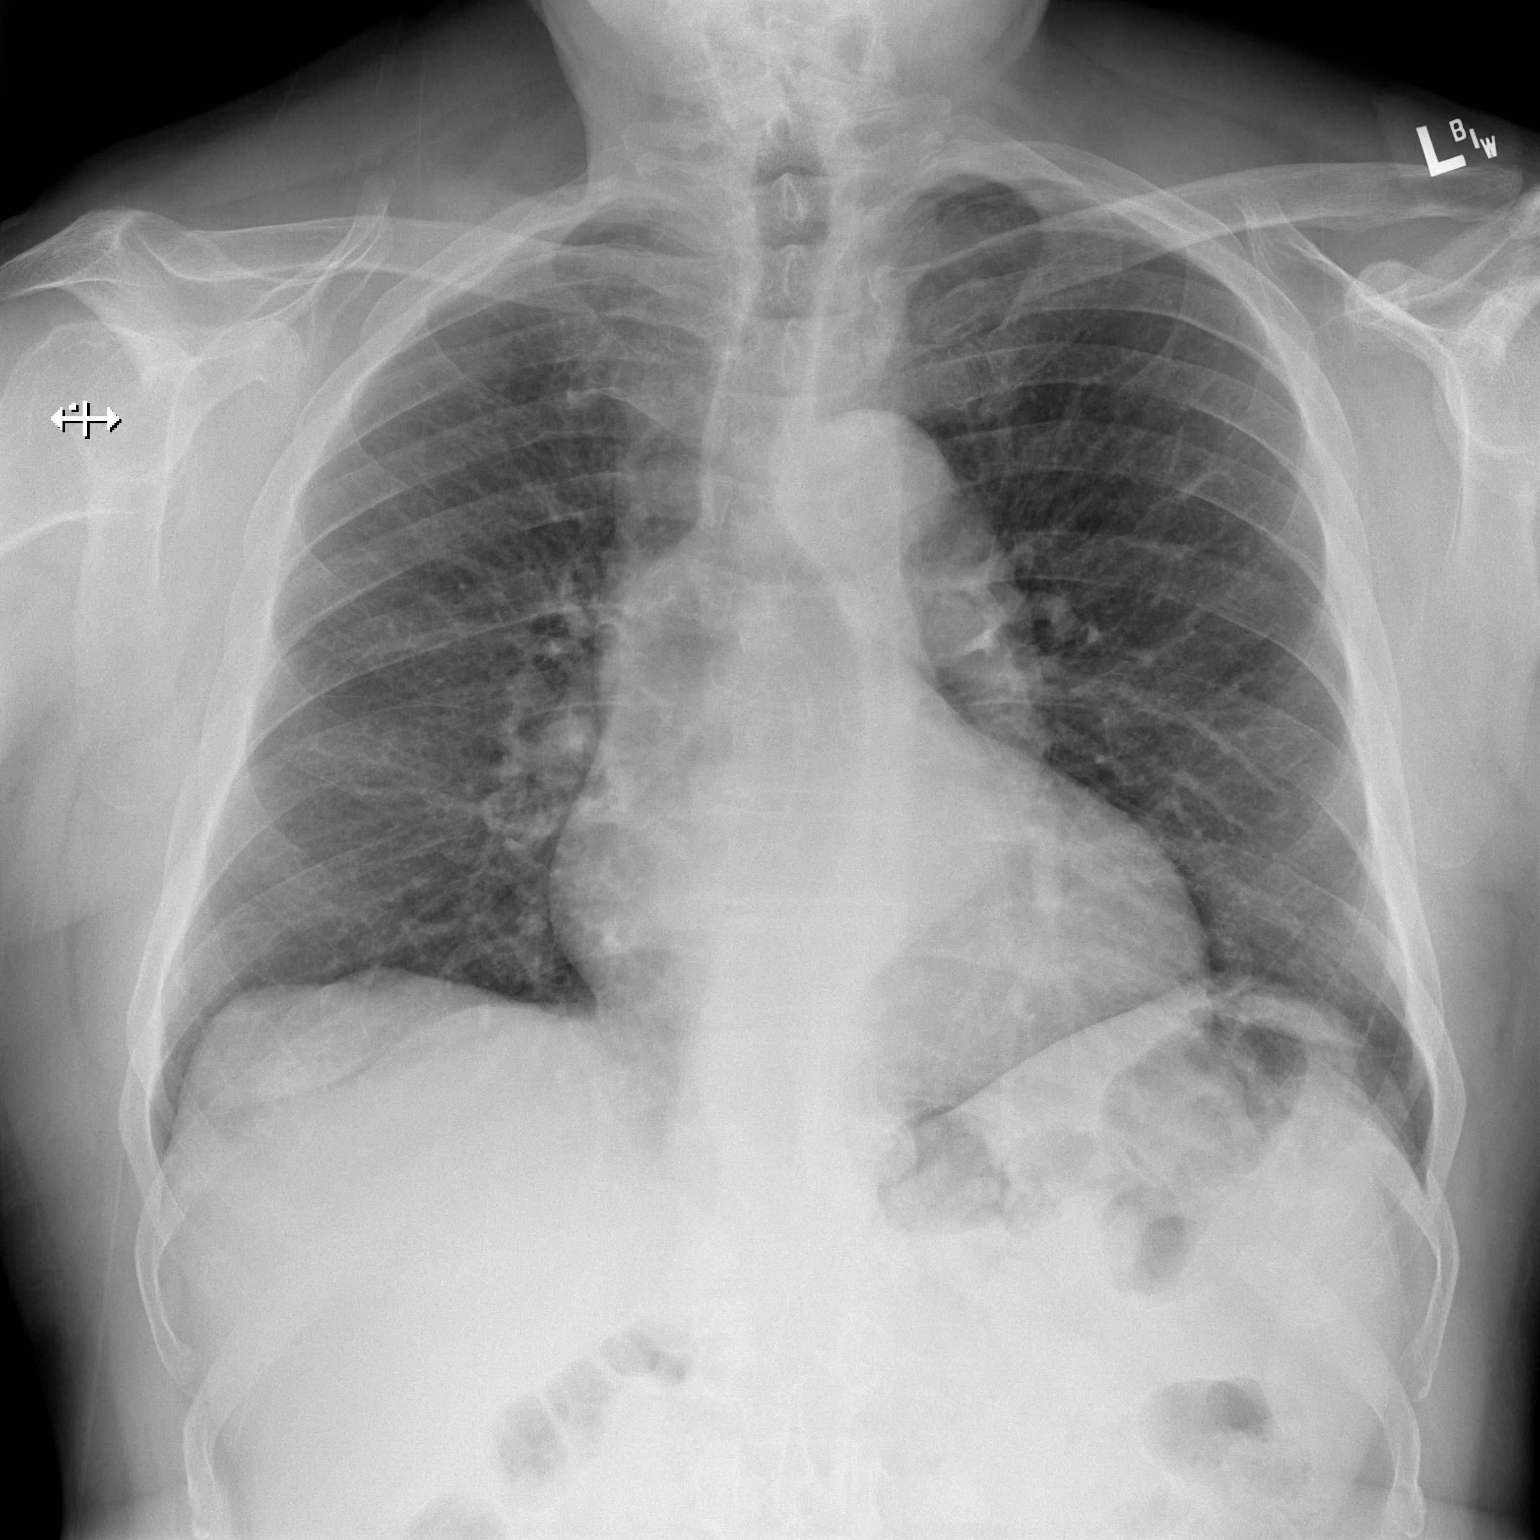

[w chest lat]
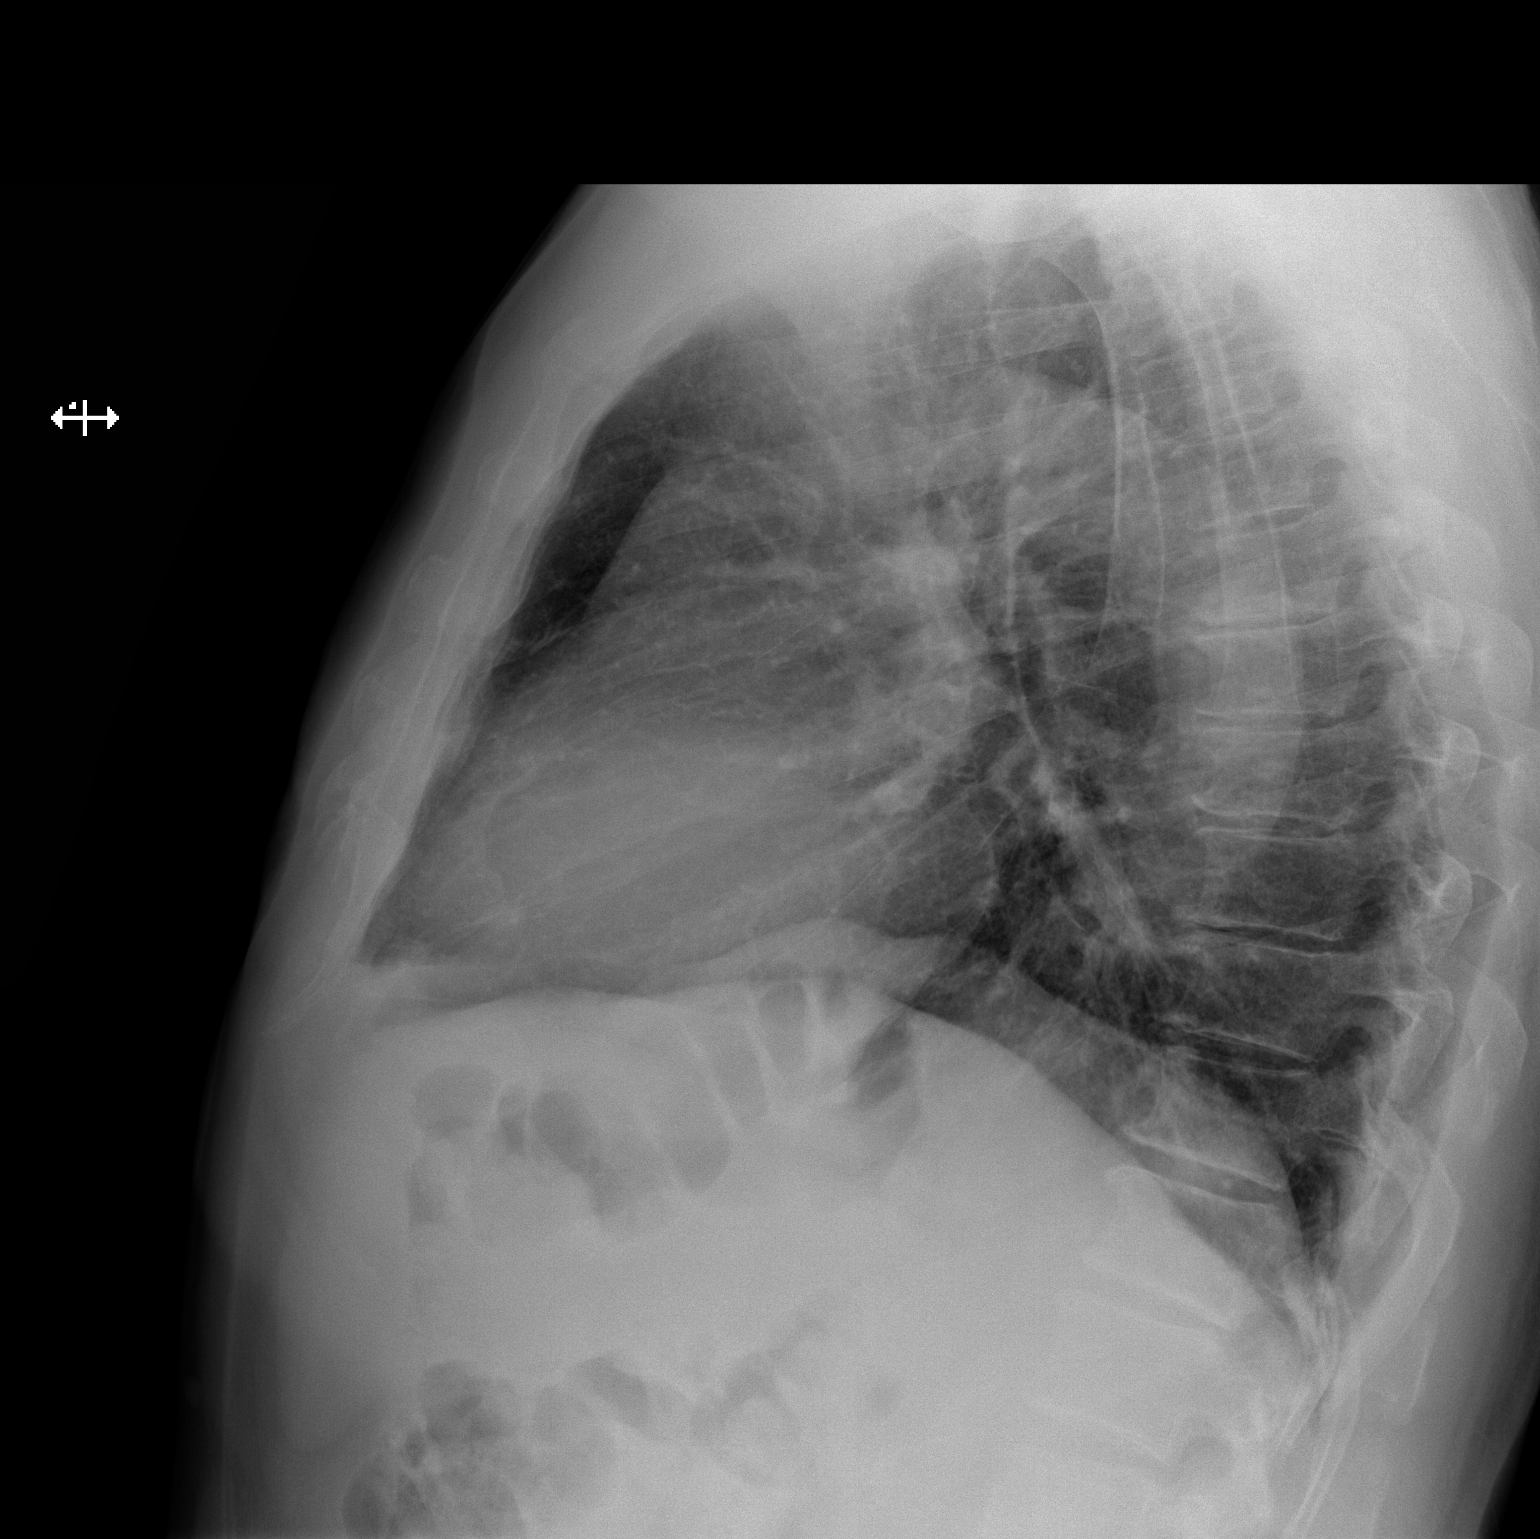

[2 of 2 positions shown; findings below may reference images not displayed]

FINDINGS: Cardiomediastinal silhouette within normal limits. No evidence of
central vascular congestion. No interlobular septal thickening.

Low lung volumes. No pneumothorax pleural effusion or confluent
airspace disease.

No displaced fracture.  Degenerative changes of the spine
IMPRESSION: No active cardiopulmonary disease.

## 2022-04-06 IMAGING — DX DG CHEST 1V PORT
1 series · 1 of 1 positions shown · non-contrast
Comparison: Chest radiograph 02/23/2021
COMPARISON: Chest radiograph 02/23/2021

Addendum:
CLINICAL DATA: Reason for exam: Atelectasis S/P MINIMALLY INVASIVE
MITRAL VALVE REPAIR (MVR) USING SAPARILLA 4D 30MM RING

EXAM:
PORTABLE CHEST 1 VIEW

[chest ap]
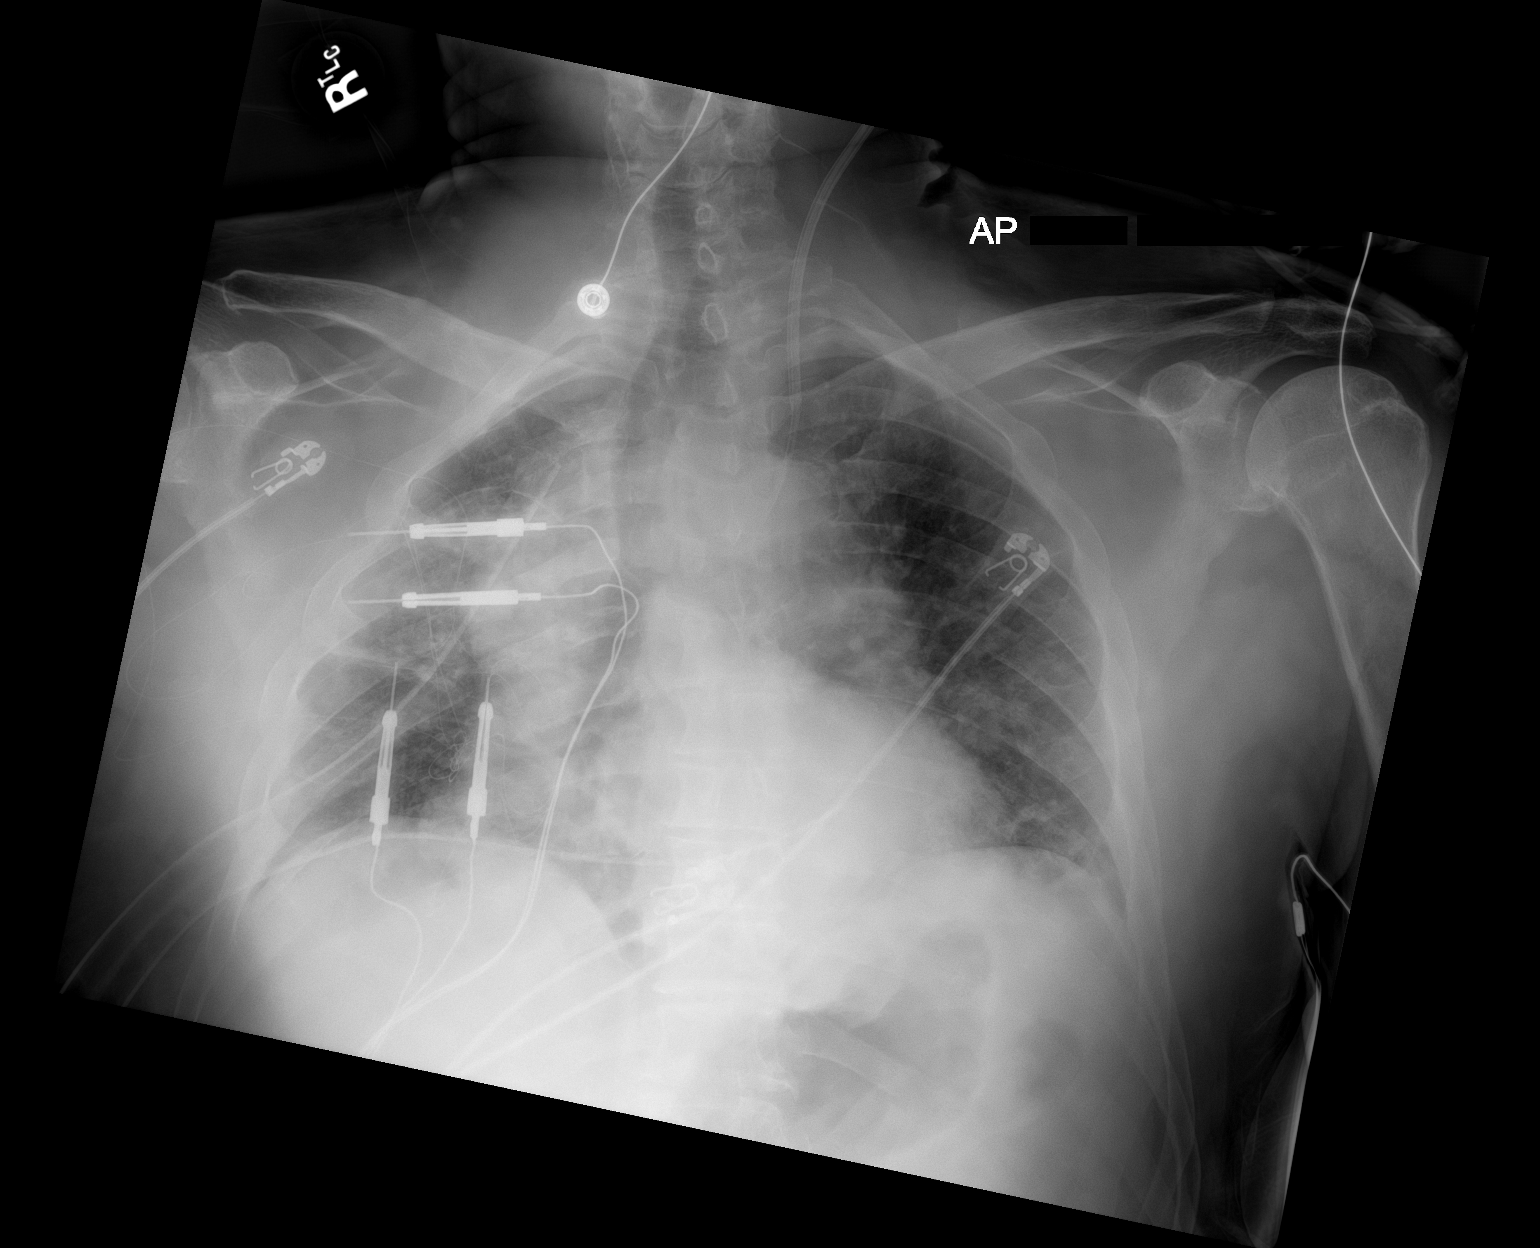

[1 of 1 positions shown; findings below may reference images not displayed]

FINDINGS: Increased prominence of the upper mediastinum compared to radiograph
02/23/2021. Patient semi-upright however cannot exclude expansion of
the aorta or hematoma in the mediastinum. Upper mediastinum measures
13.5 cm compared to 10.1 cm on comparison upright exam.

Chest tube in the RIGHT hemithorax.  No pneumothorax.
IMPRESSION: 1. Increased width of the upper mediastinum. While this may be in
part do to semi upright positioning, cannot exclude hematoma in the
upper mediastinum or abnormal aortic contour. Consider CT of the
thorax with contrast for further evaluation.
2. Two RIGHT chest tubes in place without pneumothorax.

ADDENDUM:
Findings conveyed Sravanthi [facility] on 02/25/2021  at[DATE].

*** End of Addendum ***
FINDINGS: Increased prominence of the upper mediastinum compared to radiograph
02/23/2021. Patient semi-upright however cannot exclude expansion of
the aorta or hematoma in the mediastinum. Upper mediastinum measures
13.5 cm compared to 10.1 cm on comparison upright exam.

Chest tube in the RIGHT hemithorax.  No pneumothorax.
IMPRESSION: 1. Increased width of the upper mediastinum. While this may be in
part do to semi upright positioning, cannot exclude hematoma in the
upper mediastinum or abnormal aortic contour. Consider CT of the
thorax with contrast for further evaluation.
2. Two RIGHT chest tubes in place without pneumothorax.

## 2022-04-07 IMAGING — DX DG CHEST 1V PORT
1 series · 1 of 1 positions shown · non-contrast
Comparison: 02/25/2021.  02/23/2021.

CLINICAL DATA: Chest tube.  Open-heart surgery.

EXAM:
PORTABLE CHEST 1 VIEW

[chest ap]
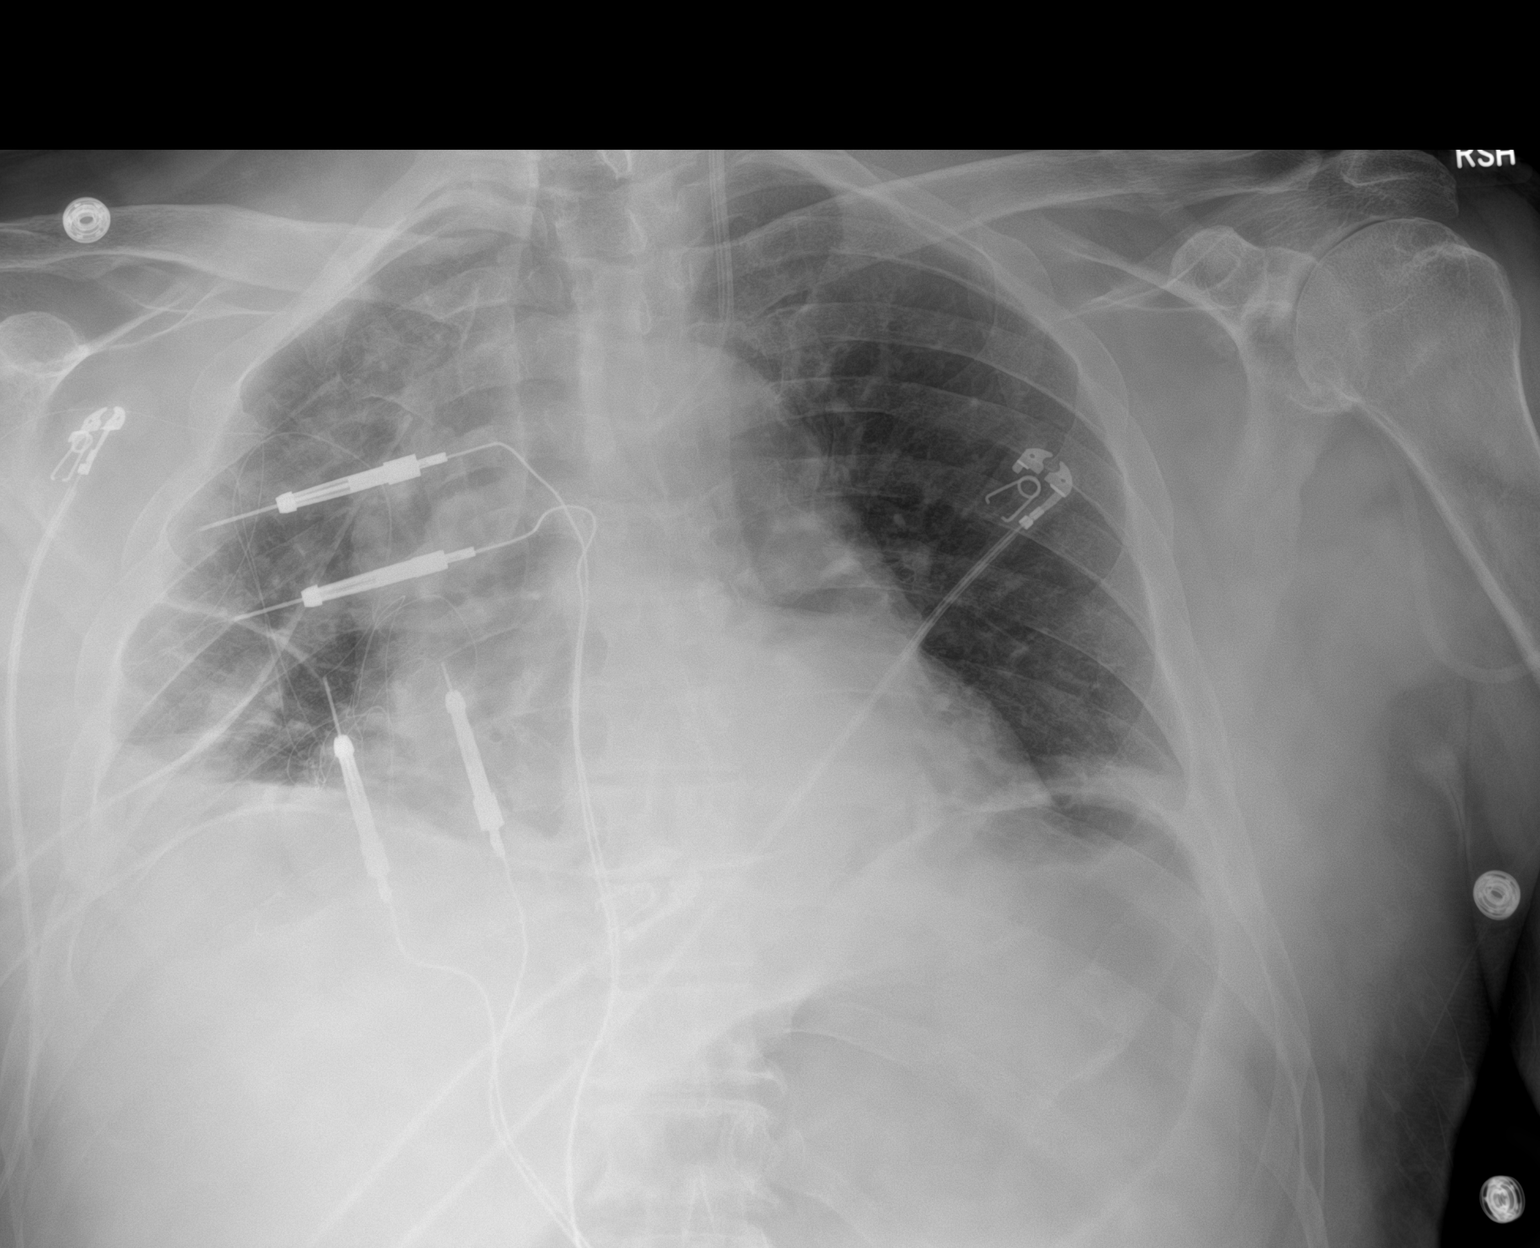

[1 of 1 positions shown; findings below may reference images not displayed]

FINDINGS: Left IJ line in stable position. Two right chest tubes in stable
position. Previously described mediastinal widening is unchanged.
Again mediastinal process cannot be completely excluded. Low lung
volumes with persistent bibasilar atelectasis. Tiny right pleural
effusion cannot be excluded. No pneumothorax gastric distention.
IMPRESSION: 1. Left IJ line stable position. Two right chest tubes in stable
position. No pneumothorax.

2. Previously described mediastinal widening is unchanged. Again a
mediastinal process cannot be completely excluded.

3. Low lung volumes with persistent bibasilar atelectasis. Tiny
right pleural effusion cannot be excluded.

4.  Gastric distention.

## 2022-04-08 LAB — HEPATIC FUNCTION PANEL
ALT: 26 IU/L (ref 0–44)
AST: 35 IU/L (ref 0–40)
Albumin: 4.2 g/dL (ref 3.8–4.8)
Alkaline Phosphatase: 72 IU/L (ref 44–121)
Bilirubin Total: 0.6 mg/dL (ref 0.0–1.2)
Bilirubin, Direct: 0.18 mg/dL (ref 0.00–0.40)
Total Protein: 6.7 g/dL (ref 6.0–8.5)

## 2022-04-08 LAB — LIPID PANEL
Chol/HDL Ratio: 3 ratio (ref 0.0–5.0)
Cholesterol, Total: 151 mg/dL (ref 100–199)
HDL: 50 mg/dL (ref >39)
LDL Chol Calc (NIH): 87 mg/dL (ref 0–99)
Triglycerides: 72 mg/dL (ref 0–149)
VLDL Cholesterol Cal: 14 mg/dL (ref 5–40)

## 2022-04-09 ENCOUNTER — Other Ambulatory Visit: Payer: Self-pay

## 2022-04-09 ENCOUNTER — Ambulatory Visit (INDEPENDENT_AMBULATORY_CARE_PROVIDER_SITE_OTHER): Payer: Medicare Other | Admitting: Cardiovascular Disease

## 2022-04-09 ENCOUNTER — Encounter: Payer: Self-pay | Admitting: Cardiovascular Disease

## 2022-04-09 DIAGNOSIS — Z9889 Other specified postprocedural states: Secondary | ICD-10-CM

## 2022-04-09 DIAGNOSIS — I10 Essential (primary) hypertension: Secondary | ICD-10-CM

## 2022-04-09 DIAGNOSIS — I480 Paroxysmal atrial fibrillation: Secondary | ICD-10-CM

## 2022-04-09 DIAGNOSIS — E782 Mixed hyperlipidemia: Secondary | ICD-10-CM | POA: Diagnosis not present

## 2022-04-09 NOTE — Patient Instructions (Signed)
Medication Instructions:  Your physician recommends that you continue on your current medications as directed. Please refer to the Current Medication list given to you today.  *If you need a refill on your cardiac medications before your next appointment, please call your pharmacy*   Lab Work: Your physician recommends that you return for lab work in: 3 months for FASTING lipid/liver profile  If you have labs (blood work) drawn today and your tests are completely normal, you will receive your results only by: [place] (if you have MyChart) OR A paper copy in the mail If you have any lab test that is abnormal or we need to change your treatment, we will call you to review the results.   Testing/Procedures: Your physician has requested that you have an echocardiogram. Echocardiography is a painless test that uses sound waves to create images of your heart. It provides your doctor with information about the size and shape of your heart and how well your heart's chambers and valves are working. This procedure takes approximately one hour. There are no restrictions for this procedure. To be done in 1 year. This procedure will be done at [address]   Follow-Up: At [facility], you and your health needs are our priority.  As part of our continuing mission to provide you with exceptional heart care, we have created designated Provider Care Teams.  These Care Teams include your primary Cardiologist (physician) and Advanced Practice Providers (APPs -  Physician Assistants and Nurse Practitioners) who all work together to provide you with the care you need, when you need it.  We recommend signing up for the patient portal called "MyChart".  Sign up information is provided on this After Visit Summary.  MyChart is used to connect with patients for Virtual Visits (Telemedicine).  Patients are able to view lab/test results, encounter notes, upcoming appointments, etc.  Non-urgent  messages can be sent to your provider as well.   To learn more about what you can do with MyChart, go to [email or web address].    Your next appointment:   12 month(s)  The format for your next appointment:   In Person  Provider:   Quay Burow, MD

## 2022-04-09 NOTE — Assessment & Plan Note (Addendum)
History of severe mitral regurgitation status post minimally invasive mitral valve repair by Dr. Roxy Manns on 02/25/2021 with a Sorin memo 4D annuloplasty ring (30 mm in size) and artificial Gore-Tex neocordl placement x6.  He is back to his baseline level of stamina.  He exercises frequently, skis and rides his bicycle.  2D echo performed 03/22/2022 revealed normal LV systolic function with no evidence of mitral regurgitation.  We will recheck this in 1 year.

## 2022-04-09 NOTE — Progress Notes (Signed)
04/09/2022 Ricky Fitzgerald   Jan 23, 1951  [id number]  Primary Physician Velna Hatchet, MD Primary Cardiologist: Lorretta Harp MD Lupe Carney, [place]  HPI:  Ricky Fitzgerald is a 71 y.o.  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 Ardeth Perfect, for evaluation of severe mitral regurgitation.  I last saw him in the office 07/15/2021.  His only risk factor for cardiovascular disease is treated hypertension.  There is no family history for heart disease.  Never had an attack or stroke.  He denies chest pain or shortness of breath.  He is very active and rides bikes and skis in the winter.  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  He is completely asymptomatic.  I performed coronary angiography on him 02/19/2021 revealing minimal nonobstructive disease in his LAD, elevated V waves on right heart cath consistent with his severe MR.  He underwent minimal invasive mitral valve repair by Dr. Roxy Manns on 02/25/2021 with a Sorin memo 4D ring annuloplasty (30 mm in size) and artificial Gore-Tex neo  cord placement x6.  His follow-up 2D echo performed 04/06/2021 revealed well-placed prosthetic annuloplasty ring with no MR.  He feels clinically improved.  He is back to riding his bicycle.  His amiodarone was discontinued as was his warfarin because of no recurrent episodes of PAF.  Since I saw him 9 months ago he has done well.  He is now back to his baseline level of activity.  He bicycles 3 times a week.  He recently spent 2 weeks skiing in [place] in [place].  He is completely asymptomatic.  His most recent 2D echo performed 03/22/2022 revealed normal LV systolic function with out evidence of  MR.  His most recent lipid profile performed 04/08/2022 revealed an LDL of 87 on atorvastatin 10 mg a day.  Current Meds  Medication Sig   augmented betamethasone dipropionate (DIPROLENE-AF) 0.05 % cream Apply 1 application topically daily as needed (irritation).   Cholecalciferol (VITAMIN D-3) 125 MCG (5000 UT) TABS Take 5,000 Units by mouth in the morning and at bedtime.   Coenzyme Q10 100 MG TABS Take 100 mg by mouth daily.   desonide (DESOWEN) 0.05 % ointment Apply 1 application topically daily as needed (irritation).   dutasteride (AVODART) 0.5 MG capsule Take 0.5 mg by mouth daily.   multivitamin (THERAGRAN) per tablet Take 1 tablet by mouth daily.   Red Yeast Rice Extract (RED YEAST RICE PO) Take 1 tablet by mouth daily at 12 noon.   telmisartan (MICARDIS) 80 MG tablet Take 1 tablet (80 mg total) by mouth daily.     No Known Allergies  Social History   Socioeconomic History   Marital status: Divorced    Spouse name: Not on file   Number of children: Not on file   Years of education: Not on file   Highest education level: Not on file  Occupational History   Occupation: physicians assistant  Tobacco Use   Smoking status: Never   Smokeless tobacco: Never  Vaping Use   Vaping Use: Never used  Substance and Sexual Activity   Alcohol use: Yes    Alcohol/week: 5.0 standard drinks  Types: 5 Cans of beer per week   Drug use: No   Sexual activity: Yes  Other Topics Concern   Not on file  Social History Narrative   ** Merged History Encounter **       Social Determinants of Health   Financial Resource Strain: Not on file  Food Insecurity: Not on file  Transportation Needs: Not on file  Physical Activity: Not on file  Stress: Not on file  Social Connections: Not on file  Intimate Partner Violence: Not on file     Review of Systems: General: negative for chills, fever, night sweats or weight changes.  Cardiovascular: negative for chest pain, dyspnea on exertion,  edema, orthopnea, palpitations, paroxysmal nocturnal dyspnea or shortness of breath Dermatological: negative for rash Respiratory: negative for cough or wheezing Urologic: negative for hematuria Abdominal: negative for nausea, vomiting, diarrhea, bright red blood per rectum, melena, or hematemesis Neurologic: negative for visual changes, syncope, or dizziness All other systems reviewed and are otherwise negative except as noted above.    Blood pressure (!) 145/82, pulse (!) 55, height '5\' 10"'$  (1.778 m), weight 198 lb 6.4 oz (90 kg), SpO2 98 %.  General appearance: alert and no distress Neck: no adenopathy, no carotid bruit, no JVD, supple, symmetrical, trachea midline, and thyroid not enlarged, symmetric, no tenderness/mass/nodules Lungs: clear to auscultation bilaterally Heart: regular rate and rhythm, S1, S2 normal, no murmur, click, rub or gallop Extremities: extremities normal, atraumatic, no cyanosis or edema Pulses: 2+ and symmetric Skin: Skin color, texture, turgor normal. No rashes or lesions  EKG sinus bradycardia 55 with incomplete right bundle branch block and left axis deviation.  Personally reviewed this EKG.  ASSESSMENT AND PLAN:   Hyperlipidemia History of hyperlipidemia on low-dose statin therapy with recent lipid profile performed yesterday revealing total cholesterol 151, LDL 87 and HDL of 50.  Given the fact that he had minor irregularities in his LAD on cath I prefer that his LAD be less than 70 for secondary prevention.  We talked about increasing his atorvastatin from 10 to 20 mg a day but he prefers to adjust his diet and recheck in 3 months.  S/P minimally-invasive mitral valve repair History of severe mitral regurgitation status post minimally invasive mitral valve repair by Dr. Roxy Manns on 02/25/2021 with a Sorin memo 4D annuloplasty ring (30 mm in size) and artificial Gore-Tex neocordl placement x6.  He is back to his baseline level of stamina.  He exercises frequently,  skis and rides his bicycle.  2D echo performed 03/22/2022 revealed normal LV systolic function with no evidence of mitral regurgitation.  We will recheck this in 1 year.  Atrial fibrillation ([place]) History of [place] atrial fibrillation initially on Coumadin anticoagulation and amiodarone both of which were discontinued.  He did have an event monitor performed last year which showed short runs of SVT, PSVT, frequent PACs and PVCs which have gotten better over time.     Lorretta Harp MD FACP,FACC,FAHA, [facility] 04/09/2022 10:52 AM

## 2022-04-09 NOTE — Assessment & Plan Note (Signed)
History of hyperlipidemia on low-dose statin therapy with recent lipid profile performed yesterday revealing total cholesterol 151, LDL 87 and HDL of 50.  Given the fact that he had minor irregularities in his LAD on cath I prefer that his LAD be less than 70 for secondary prevention.  We talked about increasing his atorvastatin from 10 to 20 mg a day but he prefers to adjust his diet and recheck in 3 months.

## 2022-04-09 NOTE — Assessment & Plan Note (Signed)
History of Ricky Fitzgerald atrial fibrillation initially on Coumadin anticoagulation and amiodarone both of which were discontinued.  He did have an event monitor performed last year which showed short runs of SVT, PSVT, frequent PACs and PVCs which have gotten better over time.

## 2022-04-10 IMAGING — DX DG CHEST 2V
2 series · 2 of 2 positions shown · non-contrast
Comparison: Portable chest x-ray earlier same day [DATE] a.m. and
previously.

CLINICAL DATA: RIGHT chest tube removal.

EXAM:
CHEST - 2 VIEW [DATE] p.m.:

[chest pa]
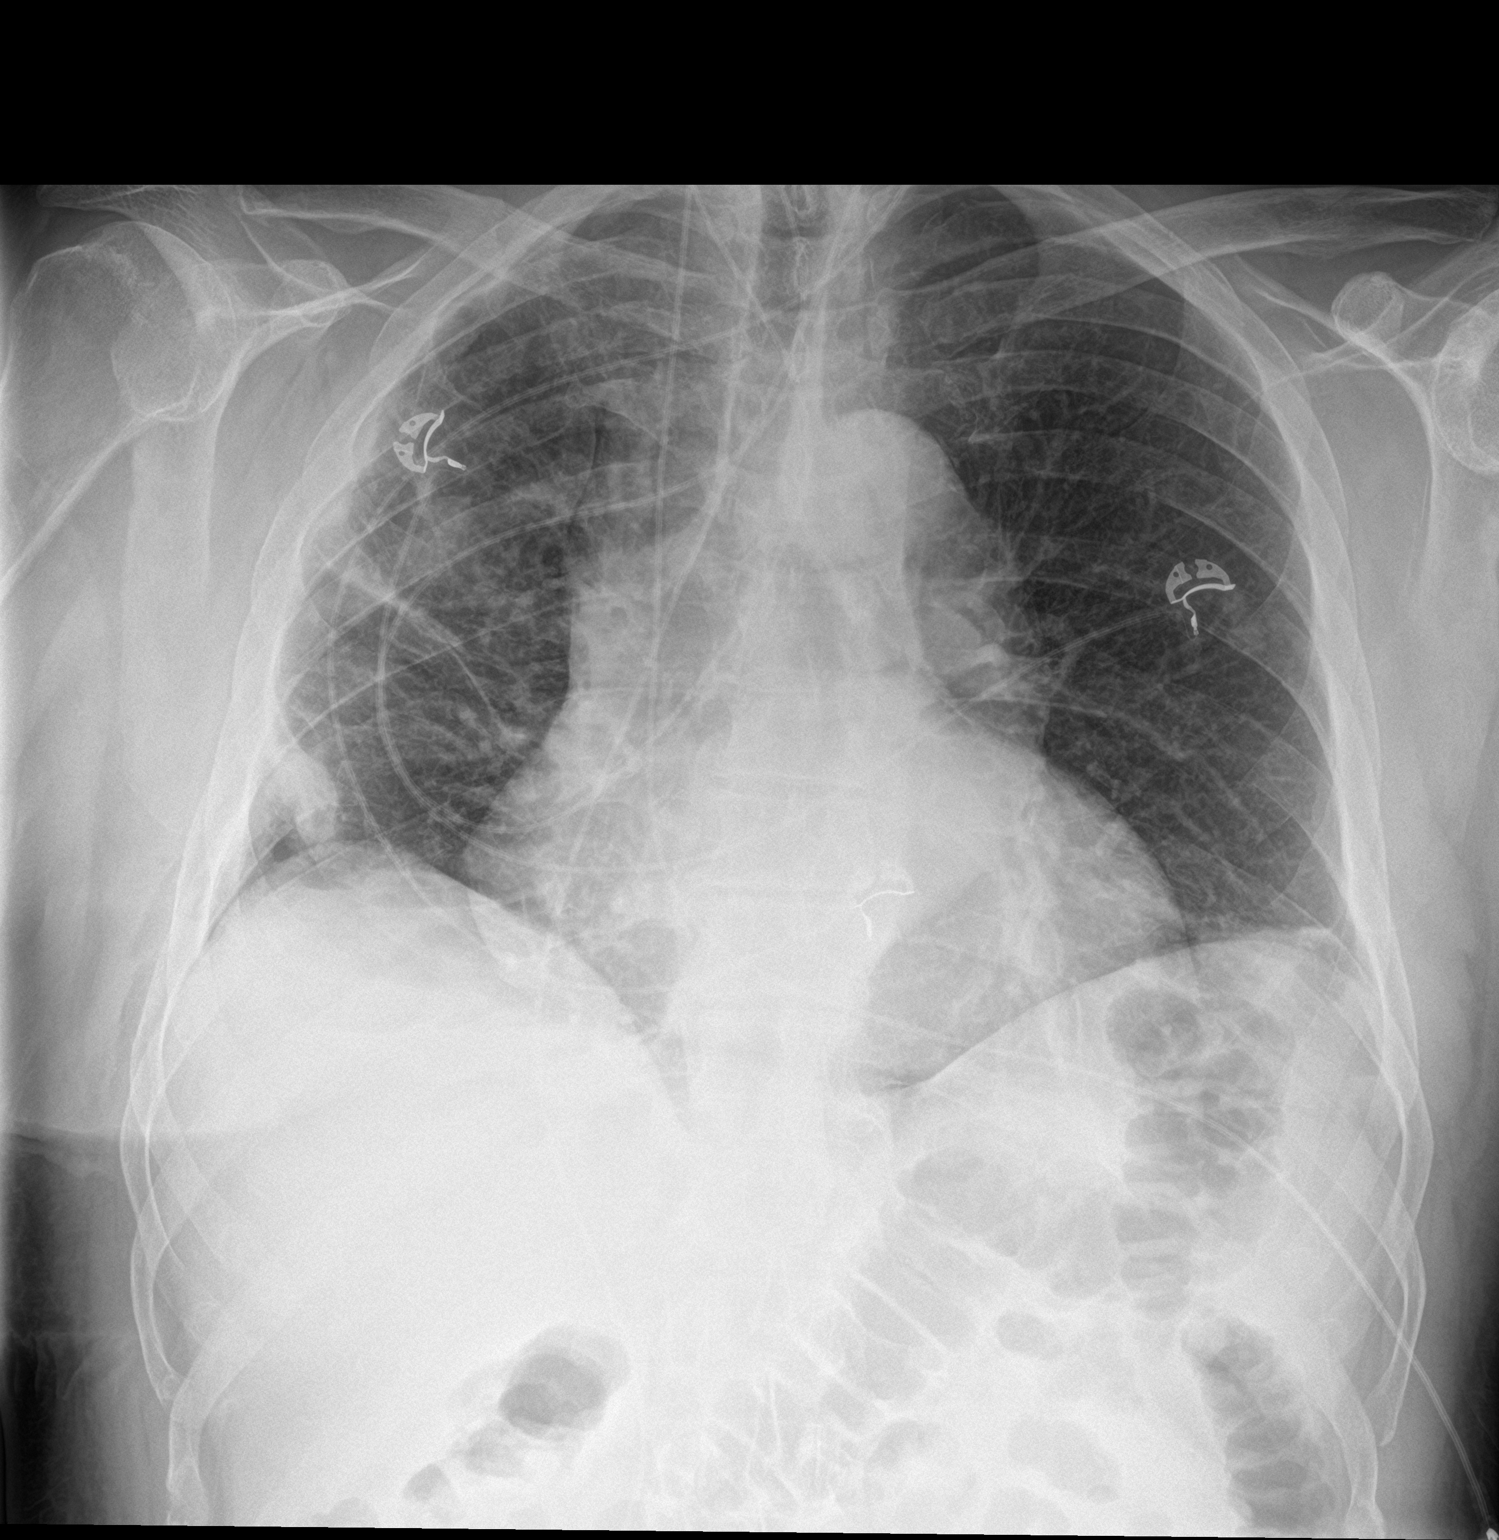

[chest lat]
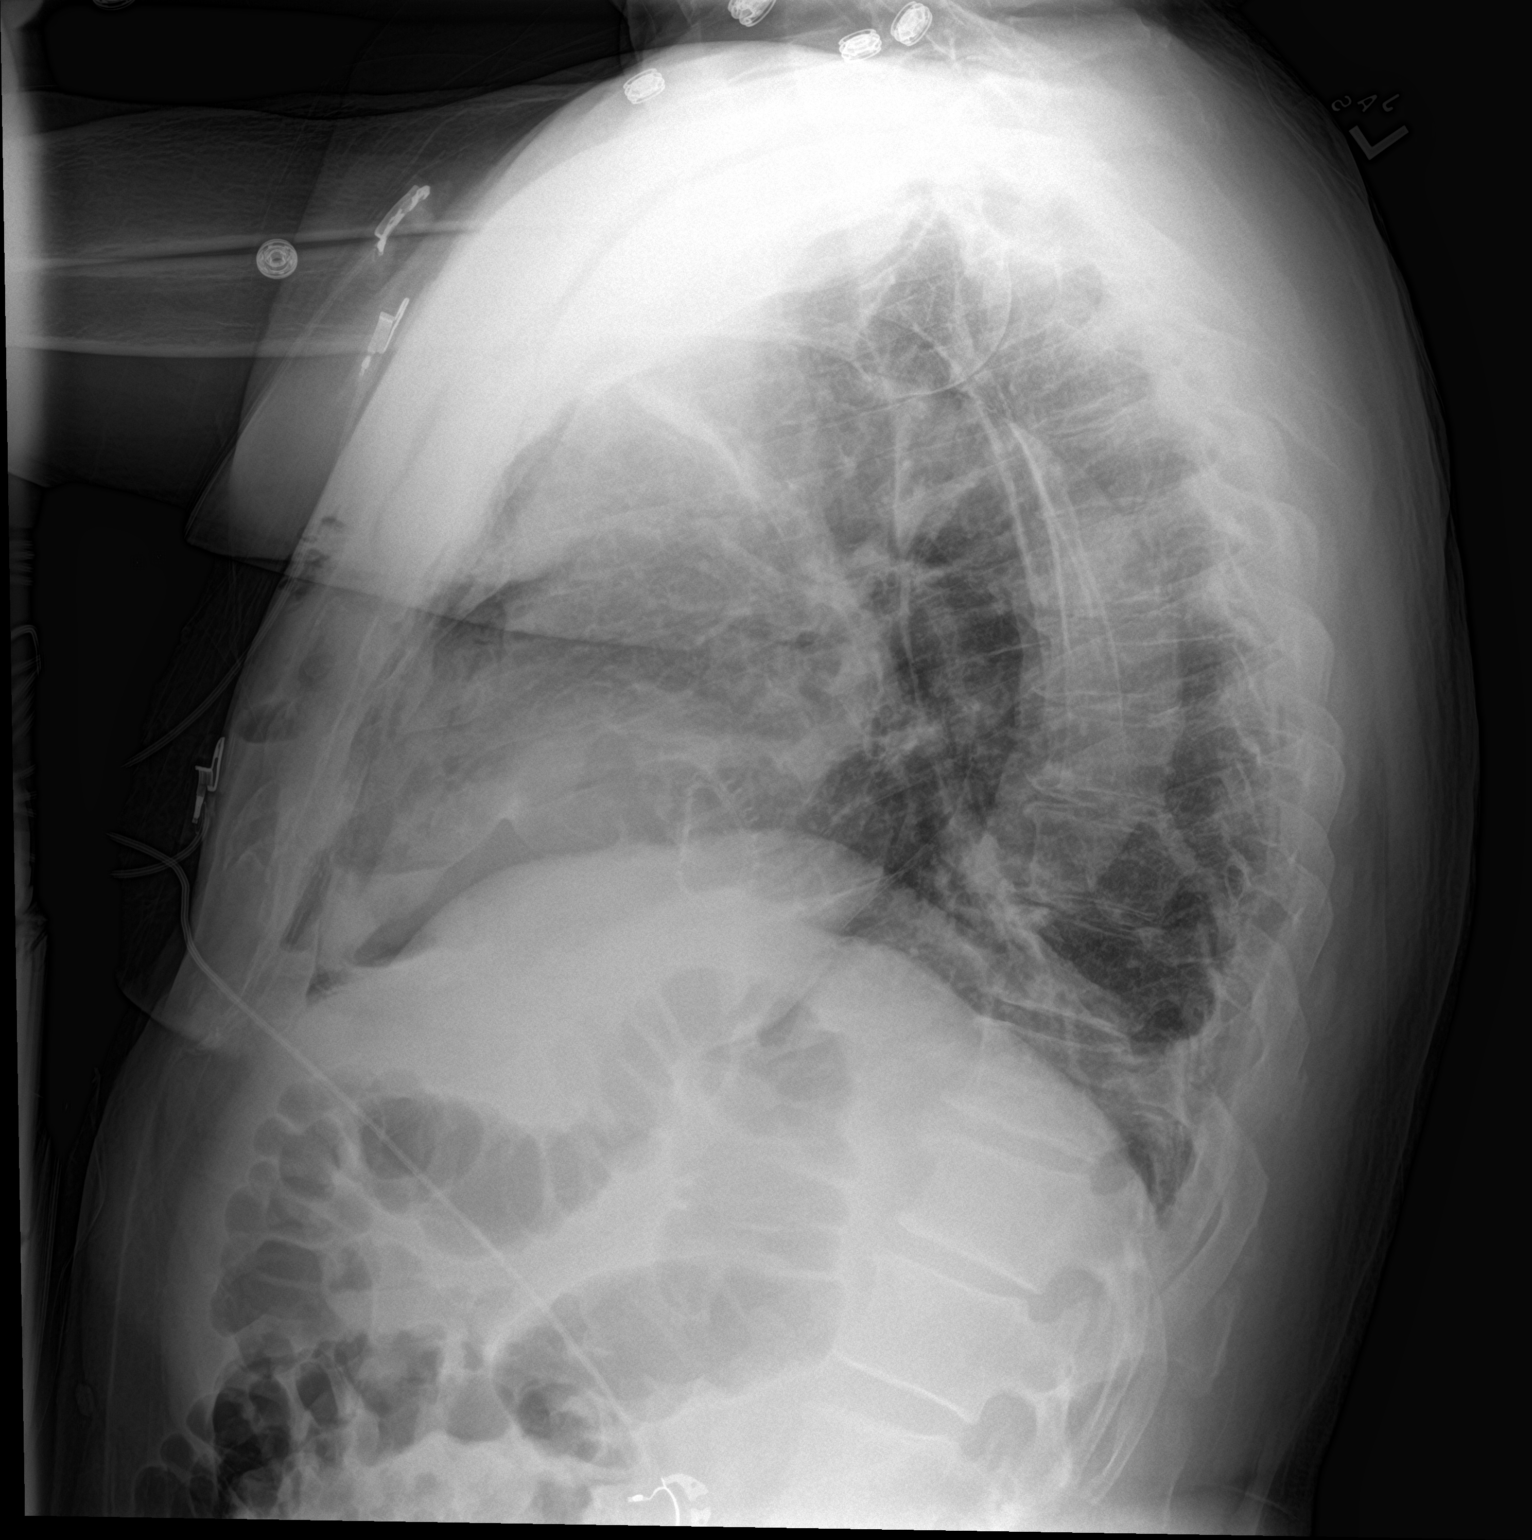

[2 of 2 positions shown; findings below may reference images not displayed]

FINDINGS: The 2 RIGHT chest tubes have been removed since earlier today. Very
small (less than 5%) RIGHT apical pneumothorax. Atelectasis in the
lung bases and in the RIGHT mid lung. Lungs otherwise clear. No
visible pleural effusions.
IMPRESSION: 1. Very small (less than 5%) RIGHT apical pneumothorax post RIGHT
chest tube removal.
2. Atelectasis in the lung bases and in the RIGHT mid lung. No acute
cardiopulmonary disease otherwise.

## 2022-06-24 ENCOUNTER — Encounter: Payer: Self-pay | Admitting: Cardiovascular Disease

## 2022-06-28 ENCOUNTER — Telehealth (HOSPITAL_BASED_OUTPATIENT_CLINIC_OR_DEPARTMENT_OTHER): Payer: Self-pay

## 2022-06-28 NOTE — Telephone Encounter (Signed)
error 

## 2022-06-28 NOTE — Telephone Encounter (Signed)
Attempted to call patient to ask where he was recently seen so that we could request records, no answer, left message to call back, will also send mychart message.

## 2022-06-28 NOTE — Telephone Encounter (Signed)
Patient returned call stating he went to [facility], contact number for records [phone]. Patient states he has the lab and EKG records. RN to call for Echo and stress test results. Call attempted, no answer, unable to leave message, fax sent to number on recording.

## 2022-06-29 ENCOUNTER — Encounter (HOSPITAL_BASED_OUTPATIENT_CLINIC_OR_DEPARTMENT_OTHER): Payer: Self-pay | Admitting: Family

## 2022-06-29 ENCOUNTER — Ambulatory Visit (INDEPENDENT_AMBULATORY_CARE_PROVIDER_SITE_OTHER): Payer: Medicare Other | Admitting: Family

## 2022-06-29 VITALS — BP 122/86 | HR 59 | Ht 70.0 in | Wt 204.0 lb

## 2022-06-29 DIAGNOSIS — R0609 Other forms of dyspnea: Secondary | ICD-10-CM | POA: Diagnosis not present

## 2022-06-29 DIAGNOSIS — I300 Acute nonspecific idiopathic pericarditis: Secondary | ICD-10-CM

## 2022-06-29 DIAGNOSIS — Z9889 Other specified postprocedural states: Secondary | ICD-10-CM

## 2022-06-29 MED ORDER — PANTOPRAZOLE SODIUM 20 MG PO TBEC: 20.0000 mg | DELAYED_RELEASE_TABLET | Freq: Every day | ORAL | 2 refills | Status: DC

## 2022-06-29 MED ORDER — COLCHICINE 0.6 MG PO TABS: 0.6000 mg | ORAL_TABLET | Freq: Two times a day (BID) | ORAL | 0 refills | Status: DC

## 2022-06-29 NOTE — Progress Notes (Signed)
Office Visit    Patient Name: Ricky Fitzgerald Date of Encounter: 06/29/2022  PCP:  Velna Hatchet, [facility]  Cardiologist:  Quay Burow, MD  Advanced Practice Provider:  No care team member to display Electrophysiologist:  None      Chief Complaint    Ricky Fitzgerald is a 71 y.o. male presents today for hospital follow up   Past Medical History    Past Medical History:  Diagnosis Date   Heart murmur    Hyperlipidemia    Hypertension    Mitral regurgitation    Mitral valve prolapse    S/P minimally-invasive mitral valve repair 02/25/2021   Ring Annuloplasty 30 mm Memo 4D Ring -SN [id number]  Placement of Neo Chords x 3 pairs   Trauma    Past Surgical History:  Procedure Laterality Date   left wrist fx     MITRAL VALVE REPAIR Right 02/25/2021   Procedure: MINIMALLY INVASIVE MITRAL VALVE REPAIR (MVR) USING MEMO 4D 30MM RING;  Surgeon: Rexene Alberts, MD;  Location: [place];  Service: Open Heart Surgery;  Laterality: Right;   PROSTATE SURGERY     biopsy x2   RIGHT/LEFT HEART CATH AND CORONARY ANGIOGRAPHY N/A 02/19/2021   Procedure: RIGHT/LEFT HEART CATH AND CORONARY ANGIOGRAPHY;  Surgeon: Lorretta Harp, MD;  Location: [place] [facility];  Service: Cardiovascular;  Laterality: N/A;   rotator cuff sur  1990   TEE WITHOUT CARDIOVERSION N/A 10/22/2020   Procedure: TRANSESOPHAGEAL ECHOCARDIOGRAM (TEE);  Surgeon: Sanda Klein, MD;  Location: [place];  Service: Cardiovascular;  Laterality: N/A;   TEE WITHOUT CARDIOVERSION N/A 02/25/2021   Procedure: TRANSESOPHAGEAL ECHOCARDIOGRAM (TEE);  Surgeon: Rexene Alberts, MD;  Location: [place];  Service: Open Heart Surgery;  Laterality: N/A;    Allergies  No Known Allergies  History of Present Illness    Ricky Fitzgerald is a 70 y.o. male with a hx of  CAD, MR s/p minimally invasive MVR, PAF,pericarditis last seen 04/09/22 by Dr. Gwenlyn Found.   Cardiac murmur auscultated by primary care which led to echocardiogram  08/29/2020 with normal LVEF, severe MR with an posterior leaflet prolapse, mild LA enlargement.  Underwent coronary angiography 02/19/2021 with minimal nonobstructive disease in LAD, elevated V waves on right heart cath consistent with severe MR.  Underwent minimally invasive MVR by Dr. On 02/25/2021 with Sorin memo 4D ring annuloplasty (41m) and artificial cord-text neocord placement x6.  Follow-up echo 04/06/2021 with well-placed prosthetic anoplasty ring with no MI.  Amiodarone and warfarin discontinued as had no recurrent episodes of atrial fibrillation.  Last seen 04/09/2022 doing overall and return to baseline level of activity bicycling 3 times per week.  Echo 04/05/2022 normal LVEF, no MR.  Lipid profile 04/08/2022 LDL 87.  Diet was adjusted he was recommended for repeat lipid panel in 3 months.  [facility]/2022 after presenting with chest pain.  Started Tuesday evening left-sided chest pressure, fatigue.  Improved within returned Wednesday and again Friday with an 8 out of 10 chest pressure lasting 3 hours radiating to his back.  He had already started ibuprofen at home for possible pericarditis and low-grade fever.  CXR 06/26/2022 with enlarged cardiac silhouette and small right pleural effusion.  Pain was worse with deep breathing.  EKG independently reviewed NSR 64 bpm with ST elevation noted in V2 through V5.  ESR 39, CRP 14.1, elevated pro BNP .  He was diuresed.  He underwent echocardiogram but  results are not available for my review.  Underwent myocardial perfusion imaging stress test 06/28/2022 normal study, motion artifact, diaphragmatic artifact, LV normal size, LV SF normal with LVEF 60%, normal wall motion, normal perfusion on rest and stress images.  Discharged on colchicine, ibuprofen.  Presents today for follow up. No chest discomfort since discharge. Tolerating colchicine without diarrhea. No recurrent fever. No dyspnea, orthopnea, PND.  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   EKGs/Labs/Other Studies Reviewed:   The following studies were reviewed today:   EKG:  No EKG today.   Recent Labs: 04/08/2022: ALT 26  Recent Lipid Panel    Component Value Date/Time   CHOL 151 04/08/2022 1008   TRIG 72 04/08/2022 1008   TRIG 99 11/08/2006 0834   HDL 50 04/08/2022 1008   CHOLHDL 3.0 04/08/2022 1008   CHOLHDL 4 12/13/2012 0853   VLDL 15.2 12/13/2012 0853   LDLCALC 87 04/08/2022 1008   LDLDIRECT 146.9 11/21/2007 0942    Home Medications   Current Meds  Medication Sig   augmented betamethasone dipropionate (DIPROLENE-AF) 0.05 % cream Apply 1 application topically daily as needed (irritation).   Cholecalciferol (VITAMIN D-3) 125 MCG (5000 UT) TABS Take 5,000 Units by mouth in the morning and at bedtime.   Coenzyme Q10 100 MG TABS Take 100 mg by mouth daily.   colchicine 0.6 MG tablet Take 1 tablet (0.6 mg total) by mouth daily. (Patient taking differently: Take 0.6 mg by mouth 2 (two) times daily.)   desonide (DESOWEN) 0.05 % ointment Apply 1 application topically daily as needed (irritation).   dutasteride (AVODART) 0.5 MG capsule Take 0.5 mg by mouth daily.   ibuprofen (ADVIL) 600 MG tablet Take 600 mg by mouth 3 (three) times daily.   multivitamin (THERAGRAN) per tablet Take 1 tablet by mouth daily.   Red Yeast Rice Extract (RED YEAST RICE PO) Take 1 tablet by mouth daily at 12 noon.   telmisartan (MICARDIS) 80 MG tablet Take 1 tablet (80 mg total) by mouth daily.   traMADol (ULTRAM) 50 MG tablet Take 1 tablet (50 mg total) by mouth every 4 (four) hours as needed for moderate pain.     Review of Systems      All other systems reviewed and are otherwise negative except as noted above.  Physical Exam    VS:  BP 122/86   Pulse (!) 59   Ht 5' 10"  (1.778 m)   Wt 204 lb (92.5 kg)   BMI 29.27 kg/m  , BMI Body mass index is 29.27 kg/m.  Wt Readings from Last 3 Encounters:   06/29/22 204 lb (92.5 kg)  04/09/22 198 lb 6.4 oz (90 kg)  07/15/21 194 lb 12.8 oz (88.4 kg)     GEN: Well nourished, well developed, in no acute distress. HEENT: normal. Neck: Supple, no JVD, carotid bruits, or masses. Cardiac: RRR, no murmurs, rubs, or gallops. No clubbing, cyanosis, edema.  Radials/PT 2+ and equal bilaterally.  Respiratory:  Respirations regular and unlabored, clear to auscultation bilaterally. GI: Soft, nontender, nondistended. MS: No deformity or atrophy. Skin: Warm and dry, no rash. Neuro:  Strength and sensation are intact. Psych: Normal affect.  Assessment & Plan    Pericarditis - Diagnosed 06/2022 at outside hospital. No recurrent chest pain.  Recommend Protonix 20 mg daily while on NSAID.  Gradually reduce ibuprofen over the next 12 days.  Continue colchicine 0.6 mg twice  daily x 3 months.  Refill provided.  Unclear etiology of pericarditis.  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 CRP, sed rate in 1-2 weeks to ensure down trending.   Elevated ProBNP - at time of pericarditis. Anticiapte related ot pericarditis. Lexiscan while admitted normal LVEF. Echo performed during admission but not available for my review, will request. Update BNP in 1-2 weeks to ensure normalized. No edema, orthopnea, PND, dyspnea.   CAD-  Stable with no anginal symptoms. No indication for ischemic evaluation.  GDMT aspirin, atorvastatin.    HLD, LDL goal less than 70 -continue atorvastatin 10 mg daily.  To ensure LDL at goal of less than 70 update lipid panel in 1 to 2 weeks with fasting labs.  If LDL not at goal increase atorvastatin.  MR s/p minimally invasive MVR by Dr. Roxy Manns 02/2021 -echo 03/22/2022 normal LVEF, no evidence of MR.  Updated echo performed during recent admission which we will request.  Atrial fibrillation -amiodarone and warfarin previously discontinued due to no recurrence.  Event monitor last year with short runs of SVT,  PSVT, frequent PVC/PAC but no atrial fibrillation.         Disposition: Follow up in 3 month(s) with Quay Burow, MD or APP.  Signed, Loel Dubonnet, NP 06/29/2022, 1:58 PM [facility]

## 2022-06-29 NOTE — Telephone Encounter (Signed)
Spoke with pt regarding appointment made for today. Pt is able to make appointment today and is aware of location. Pt verbalizes understanding.

## 2022-06-29 NOTE — Patient Instructions (Addendum)
Medication Instructions:  Your physician has recommended you make the following change in your medication:   CONTINUE Colchicine 0.'6mg'$  twice daily for 3 months  START Protonix '20mg'$  daily while on NSAID  Gradually reduce Ibuprofen. Recommend completing at least 10 days and gradually discontinuing.  For example: '600mg'$  TID x 3 days, then '600mg'$  BID x 3 days, then '400mg'$  BID x 3 days, then '400mg'$  daily x 3 days, then discontinue  *If you need a refill on your cardiac medications before your next appointment, please call your pharmacy*   Lab Work: Your physician recommends that you return for lab work in 1-2 weeks for BNP, CRP, sed-rate  Please return for Lab work. You may come to the...   [facility] (3rd floor) [address], [place], [place] [address]  Open: 8am-Noon and 1pm-4:30pm  Please ring the doorbell on the small table when you exit the elevator and the Lab Tech will come get you  [place] [facility] [address] [place], [place],  [address] Open: 8am-1pm, then 2pm-4:30pm   [place]- Please see attached locations sheet stapled to your lab work with address and hours.    If you have labs (blood work) drawn today and your tests are completely normal, you will receive your results only by: [place] (if you have MyChart) OR A paper copy in the mail If you have any lab test that is abnormal or we need to change your treatment, we will call you to review the results.   Testing/Procedures: None ordered today.   Follow-Up: At [facility], you and your health needs are our priority.  As part of our continuing mission to provide you with exceptional heart care, we have created designated Provider Care Teams.  These Care Teams include your primary Cardiologist (physician) and Advanced Practice Providers (APPs -  Physician Assistants and Nurse Practitioners) who all work together to provide you with the care you need, when  you need it.  We recommend signing up for the patient portal called "MyChart".  Sign up information is provided on this After Visit Summary.  MyChart is used to connect with patients for Virtual Visits (Telemedicine).  Patients are able to view lab/test results, encounter notes, upcoming appointments, etc.  Non-urgent messages can be sent to your provider as well.   To learn more about what you can do with MyChart, go to [email or web address].    Your next appointment:   3 month(s)  The format for your next appointment:   In Person  Provider:   Quay Burow, MD or Loel Dubonnet, NP or Odie Sera, NP    Other Instructions  Heart Healthy Diet Recommendations: A low-salt diet is recommended. Meats should be grilled, baked, or boiled. Avoid fried foods. Focus on lean protein sources like fish or chicken with vegetables and fruits. The American Heart Association is a Microbiologist!  American Heart Association Diet and Lifeystyle Recommendations   Exercise recommendations: The American Heart Association recommends 150 minutes of moderate intensity exercise weekly. Try 30 minutes of moderate intensity exercise 4-5 times per week. This could include walking, jogging, or swimming. Okay to gradually increase activity as tolerated

## 2022-07-02 ENCOUNTER — Encounter (HOSPITAL_BASED_OUTPATIENT_CLINIC_OR_DEPARTMENT_OTHER): Payer: Self-pay

## 2022-07-02 ENCOUNTER — Encounter (HOSPITAL_BASED_OUTPATIENT_CLINIC_OR_DEPARTMENT_OTHER): Payer: Self-pay | Admitting: Family

## 2022-07-05 ENCOUNTER — Encounter (HOSPITAL_BASED_OUTPATIENT_CLINIC_OR_DEPARTMENT_OTHER): Payer: Self-pay

## 2022-07-13 ENCOUNTER — Other Ambulatory Visit: Payer: Self-pay

## 2022-07-13 DIAGNOSIS — I480 Paroxysmal atrial fibrillation: Secondary | ICD-10-CM

## 2022-07-13 LAB — LIPID PANEL
Chol/HDL Ratio: 3.8 ratio (ref 0.0–5.0)
Cholesterol, Total: 150 mg/dL (ref 100–199)
HDL: 39 mg/dL — ABNORMAL LOW (ref >39)
LDL Chol Calc (NIH): 85 mg/dL (ref 0–99)
Triglycerides: 148 mg/dL (ref 0–149)
VLDL Cholesterol Cal: 26 mg/dL (ref 5–40)

## 2022-07-13 LAB — SEDIMENTATION RATE: Sed Rate: 3 mm/hr (ref 0–30)

## 2022-07-13 LAB — HEPATIC FUNCTION PANEL
ALT: 47 IU/L — ABNORMAL HIGH (ref 0–44)
AST: 49 IU/L — ABNORMAL HIGH (ref 0–40)
Albumin: 4.3 g/dL (ref 3.9–4.9)
Alkaline Phosphatase: 71 IU/L (ref 44–121)
Bilirubin Total: 0.3 mg/dL (ref 0.0–1.2)
Bilirubin, Direct: 0.12 mg/dL (ref 0.00–0.40)
Total Protein: 6.8 g/dL (ref 6.0–8.5)

## 2022-07-13 LAB — C-REACTIVE PROTEIN: CRP: 2 mg/L (ref 0–10)

## 2022-07-13 LAB — BRAIN NATRIURETIC PEPTIDE: BNP: 60.2 pg/mL (ref 0.0–100.0)

## 2022-09-13 ENCOUNTER — Other Ambulatory Visit: Payer: Self-pay | Admitting: Cardiovascular Disease

## 2022-09-22 ENCOUNTER — Encounter: Payer: Self-pay | Admitting: Cardiovascular Disease

## 2022-09-22 DIAGNOSIS — E782 Mixed hyperlipidemia: Secondary | ICD-10-CM

## 2022-09-24 LAB — LIPID PANEL
Chol/HDL Ratio: 4 ratio (ref 0.0–5.0)
Cholesterol, Total: 174 mg/dL (ref 100–199)
HDL: 43 mg/dL (ref >39)
LDL Chol Calc (NIH): 118 mg/dL — ABNORMAL HIGH (ref 0–99)
Triglycerides: 70 mg/dL (ref 0–149)
VLDL Cholesterol Cal: 13 mg/dL (ref 5–40)

## 2022-09-24 LAB — HEPATIC FUNCTION PANEL
ALT: 27 IU/L (ref 0–44)
AST: 32 IU/L (ref 0–40)
Albumin: 4.5 g/dL (ref 3.8–4.8)
Alkaline Phosphatase: 72 IU/L (ref 44–121)
Bilirubin Total: 0.5 mg/dL (ref 0.0–1.2)
Bilirubin, Direct: 0.18 mg/dL (ref 0.00–0.40)
Total Protein: 6.8 g/dL (ref 6.0–8.5)

## 2022-09-24 NOTE — Addendum Note (Signed)
Addended by: Beatrix Fetters on: 09/24/2022 09:20 AM   Modules accepted: Orders

## 2022-09-29 ENCOUNTER — Ambulatory Visit: Payer: Medicare Other | Attending: Cardiovascular Disease | Admitting: Cardiovascular Disease

## 2022-09-29 ENCOUNTER — Encounter: Payer: Self-pay | Admitting: Cardiovascular Disease

## 2022-09-29 VITALS — BP 153/101 | HR 75 | Ht 70.0 in | Wt 203.0 lb

## 2022-09-29 DIAGNOSIS — Z9889 Other specified postprocedural states: Secondary | ICD-10-CM | POA: Diagnosis not present

## 2022-09-29 DIAGNOSIS — I319 Disease of pericardium, unspecified: Secondary | ICD-10-CM | POA: Insufficient documentation

## 2022-09-29 DIAGNOSIS — E782 Mixed hyperlipidemia: Secondary | ICD-10-CM | POA: Diagnosis not present

## 2022-09-29 DIAGNOSIS — I300 Acute nonspecific idiopathic pericarditis: Secondary | ICD-10-CM | POA: Insufficient documentation

## 2022-09-29 DIAGNOSIS — I480 Paroxysmal atrial fibrillation: Secondary | ICD-10-CM | POA: Insufficient documentation

## 2022-09-29 MED ORDER — ATORVASTATIN CALCIUM 10 MG PO TABS: 10.0000 mg | ORAL_TABLET | Freq: Every day | ORAL | 3 refills | Status: DC

## 2022-09-29 NOTE — Assessment & Plan Note (Signed)
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  He was treated with indomethacin and colchicine and his symptoms have subsided.

## 2022-09-29 NOTE — Assessment & Plan Note (Signed)
History of severe mitral vegetation status post minimally invasive mitral valve repair by Dr. Nelida Meuse with a Sorin memo 4D annuloplasty ring (30 mm) and artificial Gore-Tex medial cords replacement x6.  His most recent 2D echo performed 03/22/2022 showed normal LV systolic function with a well-functioning mitral valve repair and no evidence of MR.  He is very active and rides his bike several times a week without limitation.

## 2022-09-29 NOTE — Patient Instructions (Signed)
Medication Instructions:  STOP red yeast rice  START atorvastatin '10mg'$  daily  *If you need a refill on your cardiac medications before your next appointment, please call your pharmacy*   Lab Work: FASTING lipid panel and liver function panel test in 3 months  If you have labs (blood work) drawn today and your tests are completely normal, you will receive your results only by: [place] (if you have MyChart) OR A paper copy in the mail If you have any lab test that is abnormal or we need to change your treatment, we will call you to review the results.   Testing/Procedures: Your physician has requested that you have an echocardiogram - due May 2024. Echocardiography is a painless test that uses sound waves to create images of your heart. It provides your doctor with information about the size and shape of your heart and how well your heart's chambers and valves are working. This procedure takes approximately one hour. There are no restrictions for this procedure. Please do NOT wear cologne, perfume, aftershave, or lotions (deodorant is allowed). Please arrive 15 minutes prior to your appointment time.    Follow-Up: At [facility], you and your health needs are our priority.  As part of our continuing mission to provide you with exceptional heart care, we have created designated Provider Care Teams.  These Care Teams include your primary Cardiologist (physician) and Advanced Practice Providers (APPs -  Physician Assistants and Nurse Practitioners) who all work together to provide you with the care you need, when you need it.  We recommend signing up for the patient portal called "MyChart".  Sign up information is provided on this After Visit Summary.  MyChart is used to connect with patients for Virtual Visits (Telemedicine).  Patients are able to view lab/test results, encounter notes, upcoming appointments, etc.  Non-urgent messages can be sent to your provider as well.    To learn more about what you can do with MyChart, go to [email or web address].    Your next appointment:   12 month(s)  The format for your next appointment:   In Person  Provider:   Quay Burow, MD

## 2022-09-29 NOTE — Assessment & Plan Note (Signed)
History of hyperlipidemia currently not on statin therapy because of increased LFTs.  His most recent lipid profile off of statin drugs performed 09/24/2022 revealed total cholesterol of 174, LDL 118 and HDL 43.  Given his minimal irregularities on cath and his coronary circulation I prefer to that his LDL be less than 70.  I am going to rechallenge him with low-dose atorvastatin 10 mg a day and we will recheck a lipid liver profile in 3 months.  If his LFTs go back up we will entertain alternative pharmacologic therapy such as PCSK9.

## 2022-09-29 NOTE — Progress Notes (Signed)
09/29/2022 Ricky Fitzgerald   1951/10/13  [id number]  Primary Physician Ricky Hatchet, MD Primary Cardiologist: Ricky Harp MD Lupe Carney, [place]  HPI:  Ricky Fitzgerald is a 71 y.o.   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 Ardeth Fitzgerald, for evaluation of severe mitral regurgitation.  I last saw him in the office 04/09/2022.  His only risk factor for cardiovascular disease is treated hypertension.  There is no family history for heart disease.  Never had an attack or stroke.  He denies chest pain or shortness of breath.  He is very active and rides bikes and skis in the winter.  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  He is completely asymptomatic.  I performed coronary angiography on him 02/19/2021 revealing minimal nonobstructive disease in his LAD, elevated V waves on right heart cath consistent with his severe MR.  He underwent minimal invasive mitral valve repair by Dr. Roxy Fitzgerald on 02/25/2021 with a Sorin memo 4D ring annuloplasty (30 mm in size) and artificial Gore-Tex neo  cord placement x6.  His follow-up 2D echo performed 04/06/2021 revealed well-placed prosthetic annuloplasty ring with no MR.  He feels clinically improved.  He is back to riding his bicycle.  His amiodarone was discontinued as was his warfarin because of no recurrent episodes of PAF.  Since I saw him 6 months ago he continues to do well.  He did have some atypical chest pain which was ultimately diagnosed as idiopathic pericarditis which resolved with indomethacin and colchicine after a 2D echo showed no pericardial effusion and a Myoview stress test was nonischemic.  He is very active and  rides his bike several times a week without limitation.  He did have elevated LFTs thought to be related to statin therapy which resolved on statin holiday although his LDL went up to 118.  We will rechallenge him with low-dose statin therapy.   Current Meds  Medication Sig   augmented betamethasone dipropionate (DIPROLENE-AF) 0.05 % cream Apply 1 application topically daily as needed (irritation).   Cholecalciferol (VITAMIN D-3) 125 MCG (5000 UT) TABS Take 5,000 Units by mouth in the morning and at bedtime.   Coenzyme Q10 100 MG TABS Take 100 mg by mouth daily.   colchicine 0.6 MG tablet Take 1 tablet (0.6 mg total) by mouth 2 (two) times daily.   desonide (DESOWEN) 0.05 % ointment Apply 1 application topically daily as needed (irritation).   dutasteride (AVODART) 0.5 MG capsule Take 0.5 mg by mouth daily.   ibuprofen (ADVIL) 600 MG tablet Take 600 mg by mouth 3 (three) times daily.   multivitamin (THERAGRAN) per tablet Take 1 tablet by mouth daily.   pantoprazole (PROTONIX) 20 MG tablet Take 1 tablet (20 mg total) by mouth daily.   telmisartan (MICARDIS) 80 MG tablet Take 1 tablet (80 mg total) by mouth daily.   [DISCONTINUED] Red Yeast Rice Extract (RED YEAST RICE PO) Take 1 tablet by mouth daily at 12 noon.     No Known Allergies  Social History   Socioeconomic History   Marital status: Divorced    Spouse name: Not on file   Number  of children: Not on file   Years of education: Not on file   Highest education level: Not on file  Occupational History   Occupation: physicians assistant  Tobacco Use   Smoking status: Never   Smokeless tobacco: Never  Vaping Use   Vaping Use: Never used  Substance and Sexual Activity   Alcohol use: Yes    Alcohol/week: 5.0 standard drinks of alcohol    Types: 5 Cans of beer per week   Drug use: No   Sexual activity: Yes  Other Topics Concern   Not on file  Social History Narrative   ** Merged History Encounter **       Social  Determinants of Health   Financial Resource Strain: Not on file  Food Insecurity: Not on file  Transportation Needs: Not on file  Physical Activity: Not on file  Stress: Not on file  Social Connections: Not on file  Intimate Partner Violence: Not on file     Review of Systems: General: negative for chills, fever, night sweats or weight changes.  Cardiovascular: negative for chest pain, dyspnea on exertion, edema, orthopnea, palpitations, paroxysmal nocturnal dyspnea or shortness of breath Dermatological: negative for rash Respiratory: negative for cough or wheezing Urologic: negative for hematuria Abdominal: negative for nausea, vomiting, diarrhea, bright red blood per rectum, melena, or hematemesis Neurologic: negative for visual changes, syncope, or dizziness All other systems reviewed and are otherwise negative except as noted above.    Blood pressure (!) 153/101, pulse 75, height '5\' 10"'$  (1.778 m), weight 203 lb (92.1 kg), SpO2 99 %.  General appearance: alert and no distress Neck: no adenopathy, no carotid bruit, no JVD, supple, symmetrical, trachea midline, and thyroid not enlarged, symmetric, no tenderness/mass/nodules Lungs: clear to auscultation bilaterally Heart: regular rate and rhythm, S1, S2 normal, no murmur, click, rub or gallop Extremities: extremities normal, atraumatic, no cyanosis or edema Pulses: 2+ and symmetric Skin: Skin color, texture, turgor normal. No rashes or lesions Neurologic: Grossly normal  EKG not performed today  ASSESSMENT AND PLAN:   Hyperlipidemia History of hyperlipidemia currently not on statin therapy because of increased LFTs.  His most recent lipid profile off of statin drugs performed 09/24/2022 revealed total cholesterol of 174, LDL 118 and HDL 43.  Given his minimal irregularities on cath and his coronary circulation I prefer to that his LDL be less than 70.  I am going to rechallenge him with low-dose atorvastatin 10 mg a day and we  will recheck a lipid liver profile in 3 months.  If his LFTs go back up we will entertain alternative pharmacologic therapy such as PCSK9.  S/P minimally-invasive mitral valve repair History of severe mitral vegetation status post minimally invasive mitral valve repair by Dr. Nelida Meuse with a Sorin memo 4D annuloplasty ring (30 mm) and artificial Gore-Tex medial cords replacement x6.  His most recent 2D echo performed 03/22/2022 showed normal LV systolic function with a well-functioning mitral valve repair and no evidence of MR.  He is very active and rides his bike several times a week without limitation.  Pericarditis Mr. Speigner had idiopathic pericarditis over the last several months with a 2D echo that showed no pericardial effusion and a Myoview that showed no ischemia.  He was treated with indomethacin and colchicine and his symptoms have subsided.     Ricky Harp MD FACP,FACC,FAHA, [facility] 09/29/2022 10:44 AM

## 2023-03-30 ENCOUNTER — Ambulatory Visit (HOSPITAL_COMMUNITY): Payer: Medicare Other | Attending: Cardiovascular Disease

## 2023-03-30 DIAGNOSIS — Z9889 Other specified postprocedural states: Secondary | ICD-10-CM | POA: Insufficient documentation

## 2023-03-30 DIAGNOSIS — I480 Paroxysmal atrial fibrillation: Secondary | ICD-10-CM | POA: Diagnosis present

## 2023-03-30 LAB — ECHOCARDIOGRAM COMPLETE
Area-P 1/2: 2.39 cm2
MV VTI: 1.46 cm2
S' Lateral: 2.5 cm

## 2023-08-01 LAB — LAB REPORT - SCANNED: EGFR: 73.5

## 2023-08-11 ENCOUNTER — Encounter: Payer: Self-pay | Admitting: Internal Medicine

## 2023-10-05 ENCOUNTER — Ambulatory Visit: Payer: Medicare Other | Attending: Cardiovascular Disease | Admitting: Cardiovascular Disease

## 2023-10-05 ENCOUNTER — Encounter: Payer: Self-pay | Admitting: Cardiovascular Disease

## 2023-10-05 VITALS — BP 130/62 | HR 51 | Ht 70.0 in | Wt 204.0 lb

## 2023-10-05 DIAGNOSIS — I300 Acute nonspecific idiopathic pericarditis: Secondary | ICD-10-CM

## 2023-10-05 DIAGNOSIS — I7121 Aneurysm of the ascending aorta, without rupture: Secondary | ICD-10-CM

## 2023-10-05 DIAGNOSIS — Z9889 Other specified postprocedural states: Secondary | ICD-10-CM

## 2023-10-05 DIAGNOSIS — E782 Mixed hyperlipidemia: Secondary | ICD-10-CM

## 2023-10-05 DIAGNOSIS — I059 Rheumatic mitral valve disease, unspecified: Secondary | ICD-10-CM | POA: Diagnosis not present

## 2023-10-05 DIAGNOSIS — I10 Essential (primary) hypertension: Secondary | ICD-10-CM | POA: Diagnosis not present

## 2023-10-05 DIAGNOSIS — I712 Thoracic aortic aneurysm, without rupture, unspecified: Secondary | ICD-10-CM | POA: Insufficient documentation

## 2023-10-05 NOTE — Assessment & Plan Note (Signed)
Small thoracic aortic aneurysm measuring 42 mm by 2D echo 03/30/2023.  This will be followed noninvasively on an annual basis.

## 2023-10-05 NOTE — Assessment & Plan Note (Signed)
History of pericarditis post mitral valve repair resolved with conservative pharmacologic therapy without recurrence.

## 2023-10-05 NOTE — Patient Instructions (Signed)
  Testing/Procedures: Your physician has requested that you have an echocardiogram in May 2025.[facility] Echocardiography is a painless test that uses sound waves to create images of your heart. It provides your doctor with information about the size and shape of your heart and how well your heart's chambers and valves are working. This procedure takes approximately one hour. There are no restrictions for this procedure. Please do NOT wear cologne, perfume, aftershave, or lotions (deodorant is allowed). [address] Please arrive 15 minutes prior to your appointment time.  Please note: We ask at that you not bring children with you during ultrasound (echo/ vascular) testing. Due to room size and safety concerns, children are not allowed in the ultrasound rooms during exams. Our front office staff cannot provide observation of children in our lobby area while testing is being conducted. An adult accompanying a patient to their appointment will only be allowed in the ultrasound room at the discretion of the ultrasound technician under special circumstances. We apologize for any inconvenience.    Follow-Up: At [facility], you and your health needs are our priority.  As part of our continuing mission to provide you with exceptional heart care, we have created designated Provider Care Teams.  These Care Teams include your primary Cardiologist (physician) and Advanced Practice Providers (APPs -  Physician Assistants and Nurse Practitioners) who all work together to provide you with the care you need, when you need it.  We recommend signing up for the patient portal called "MyChart".  Sign up information is provided on this After Visit Summary.  MyChart is used to connect with patients for Virtual Visits (Telemedicine).  Patients are able to view lab/test results, encounter notes, upcoming appointments, etc.  Non-urgent messages can be sent to your provider as well.   To learn more about what you can do  with MyChart, go to [email or web address].    Your next appointment:   12 month(s)  Provider:   Nanetta Batty, MD

## 2023-10-05 NOTE — Progress Notes (Signed)
10/05/2023 Ricky Fitzgerald   [birth year]  [id number]  Primary Physician Alysia Penna, MD Primary Cardiologist: Runell Gess MD Nicholes Calamity, [place]  HPI:  Ricky Fitzgerald is a 72 y.o.   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 Link Snuffer, for evaluation of severe mitral regurgitation.  I last saw him in the office 09/29/2022.  His only risk factor for cardiovascular disease is treated hypertension.  There is no family history for heart disease.  Never had an attack or stroke.  He denies chest pain or shortness of breath.  He is very active and rides bikes and skis in the winter.  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  He is completely asymptomatic.  I performed coronary angiography on him 02/19/2021 revealing minimal nonobstructive disease in his LAD, elevated V waves on right heart cath consistent with his severe MR.  He underwent minimal invasive mitral valve repair by Dr. Cornelius Moras on 02/25/2021 with a Sorin memo 4D ring annuloplasty (30 mm in size) and artificial Gore-Tex neo  cord placement x6.  His follow-up 2D echo performed 04/06/2021 revealed well-placed prosthetic annuloplasty ring with no MR.  He feels clinically improved.  He is back to riding his bicycle.  His amiodarone was discontinued as was his warfarin because of no recurrent episodes of PAF.  He had some atypical chest pain which was ultimately diagnosed as idiopathic pericarditis which resolved with indomethacin and colchicine after a 2D echo showed no pericardial effusion and a Myoview stress test was nonischemic.  He is very active and rides his bike several times a week without limitation.  He  did have elevated LFTs thought to be related to statin therapy which resolved on statin holiday although his LDL went up to 118.    Since I saw him a year ago he remained stable.  He has had no recurrent pericarditis.  He denies chest pain or shortness of breath.  He rides a bicycle 1 hour a day with up to 1200 feet of elevation without symptoms.  His most recent 2D echo performed 03/30/2023 revealed normal LV systolic function with trivial MR.  He did have a 42 mm ascending thoracic aorta.  With reach challenge of low-dose atorvastatin his most recent lipid profile performed 08/02/2023 revealed total cholesterol 55, LDL 85 and HDL 50.  Transaminases remain low.  Current Meds  Medication Sig   atorvastatin (LIPITOR) 10 MG tablet Take 1 tablet (10 mg total) by mouth daily.   augmented betamethasone dipropionate (DIPROLENE-AF) 0.05 % cream Apply 1 application topically daily as needed (irritation).   Cholecalciferol (VITAMIN D-3) 125 MCG (5000 UT) TABS Take 5,000 Units by mouth in the morning and at bedtime.   Coenzyme Q10 100 MG TABS Take 100 mg by mouth daily.   desonide (DESOWEN) 0.05 % ointment Apply 1 application topically daily as needed (irritation).   dutasteride (AVODART) 0.5 MG capsule Take 0.5 mg by mouth daily.   multivitamin (THERAGRAN) per tablet Take 1 tablet by mouth daily.   telmisartan (MICARDIS) 80 MG tablet Take 1 tablet (80 mg total) by mouth daily.     No Known Allergies  Social History   Socioeconomic History   Marital status: Divorced    Spouse name: Not on file   Number of children: Not on file   Years of education: Not on file   Highest education level: Not on file  Occupational History   Occupation: physicians assistant  Tobacco Use   Smoking status: Never   Smokeless tobacco: Never  Vaping Use   Vaping status: Never Used  Substance and Sexual Activity   Alcohol use: Yes    Alcohol/week: 5.0 standard drinks of alcohol    Types: 5 Cans of beer per week   Drug  use: No   Sexual activity: Yes  Other Topics Concern   Not on file  Social History Narrative   ** Merged History Encounter **       Social Determinants of Health   Financial Resource Strain: Low Risk  (10/26/2022)   Received from [facility], [facility]   Overall Financial Resource Strain (CARDIA)    Difficulty of Paying Living Expenses: Not hard at all  Food Insecurity: Low Risk  (10/04/2023)   Received from [facility]   Hunger Vital Sign    Worried About Running Out of Food in the Last Year: Never true    Ran Out of Food in the Last Year: Never true  Transportation Needs: No Transportation Needs (10/04/2023)   Received from [facility]    In the past 12 months, has lack of reliable transportation kept you from medical appointments, meetings, work or from getting things needed for daily living? : No  Physical Activity: Sufficiently Active (10/26/2022)   Received from [facility], [facility]   Exercise Vital Sign    Days of Exercise per Week: 4 days    Minutes of Exercise per Session: 60 min  Stress: No Stress Concern Present (10/26/2022)   Received from [facility], [facility]   [employer] of Occupational Health - Occupational Stress Questionnaire    Feeling of Stress : Not at all  Social Connections: Socially Integrated (10/26/2022)   Received from [facility], [facility]   Social Network    How would you rate your social network (family, work, friends)?: Good participation with social networks  Intimate Partner Violence: Not At Risk (10/26/2022)   Received from [facility], [facility]   HITS    Over the last 12 months how often did your partner physically hurt you?: Never    Over the last 12 months how often did your partner insult you or talk down to you?: Never    Over the last 12 months how often did your partner threaten you with physical harm?: Never    Over the last 12 months how often did your partner scream or curse  at you?: Never     Review of Systems: General: negative for chills, fever, night sweats or weight changes.  Cardiovascular: negative for chest pain, dyspnea on exertion, edema, orthopnea, palpitations, paroxysmal nocturnal dyspnea or shortness of breath Dermatological: negative for rash Respiratory: negative for cough or wheezing Urologic: negative for hematuria Abdominal: negative for nausea, vomiting, diarrhea, bright red blood per rectum, melena, or hematemesis Neurologic: negative for visual changes, syncope, or dizziness All other systems reviewed and are otherwise negative except as noted above.    Blood pressure 130/62, pulse (!) 51, height 5\' 10"  (1.778 m), weight 204 lb (92.5 kg), SpO2 92%.  General appearance: alert and no distress Neck: no adenopathy, no carotid bruit, no JVD, supple, symmetrical,  trachea midline, and thyroid not enlarged, symmetric, no tenderness/mass/nodules Lungs: clear to auscultation bilaterally Heart: regular rate and rhythm, S1, S2 normal, no murmur, click, rub or gallop Extremities: extremities normal, atraumatic, no cyanosis or edema Pulses: 2+ and symmetric Skin: Skin color, texture, turgor normal. No rashes or lesions Neurologic: Grossly normal  EKG EKG Interpretation Date/Time:  Wednesday October 05 2023 09:44:15 EST Ventricular Rate:  51 PR Interval:  194 QRS Duration:  106 QT Interval:  450 QTC Calculation: 414 R Axis:   -73  Text Interpretation: Sinus bradycardia with Premature atrial complexes Left axis deviation Incomplete right bundle branch block When compared with ECG of 18-Sep-2002 10:57, Premature atrial complexes are now Present Confirmed by Nanetta Batty [phone]) on 10/05/2023 10:00:24 AM    ASSESSMENT AND PLAN:   Hyperlipidemia History of hyperlipidemia on statin therapy with his most recent lipid profile performed 08/02/2023 revealing total cholesterol 155, LDL of 85 and HDL of 50.  He did have mild transaminasemia and had  to take a statin holiday.  These results represent rechallenge.  He had blood work done this morning.  He is on atorvastatin 10 mg a day.  He is not at goal for secondary prevention given his mild nonobstructive CAD at cath.  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  He underwent minimally invasive mitral valve repair by Dr. Cornelius Moras 02/25/21 with a Sorin memo 4D annuloplasty ring (30 mm in size), and artificial Gore-Tex needle cord placement x 6.  His symptoms ultimately resolved.  He is an active bicyclist.  His most recent 2D echo performed 03/30/2023 revealed normal LV systolic function with trivial MR.  Will continue to follow him noninvasively.  Pericarditis History of pericarditis post mitral valve repair resolved with conservative pharmacologic therapy without recurrence.  Thoracic aortic aneurysm (HCC) Small thoracic aortic aneurysm measuring 42 mm by 2D echo 03/30/2023.  This will be followed noninvasively on an annual basis.     Runell Gess MD FACP,FACC,FAHA, [facility] 10/05/2023 10:12 AM

## 2023-10-05 NOTE — Assessment & Plan Note (Signed)
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  He underwent minimally invasive mitral valve repair by Dr. Cornelius Moras 02/25/21 with a Sorin memo 4D annuloplasty ring (30 mm in size), and artificial Gore-Tex needle cord placement x 6.  His symptoms ultimately resolved.  He is an active bicyclist.  His most recent 2D echo performed 03/30/2023 revealed normal LV systolic function with trivial MR.  Will continue to follow him noninvasively.

## 2023-10-05 NOTE — Assessment & Plan Note (Signed)
History of hyperlipidemia on statin therapy with his most recent lipid profile performed 08/02/2023 revealing total cholesterol 155, LDL of 85 and HDL of 50.  He did have mild transaminasemia and had to take a statin holiday.  These results represent rechallenge.  He had blood work done this morning.  He is on atorvastatin 10 mg a day.  He is not at goal for secondary prevention given his mild nonobstructive CAD at cath.

## 2023-10-06 ENCOUNTER — Other Ambulatory Visit: Payer: Self-pay

## 2023-10-06 DIAGNOSIS — E782 Mixed hyperlipidemia: Secondary | ICD-10-CM

## 2023-10-06 LAB — HEPATIC FUNCTION PANEL
ALT: 25 [IU]/L (ref 0–44)
AST: 29 [IU]/L (ref 0–40)
Albumin: 4.1 g/dL (ref 3.8–4.8)
Alkaline Phosphatase: 68 [IU]/L (ref 44–121)
Bilirubin Total: 0.6 mg/dL (ref 0.0–1.2)
Bilirubin, Direct: 0.21 mg/dL (ref 0.00–0.40)
Total Protein: 6.8 g/dL (ref 6.0–8.5)

## 2023-10-06 LAB — LIPID PANEL
Chol/HDL Ratio: 3.1 {ratio} (ref 0.0–5.0)
Cholesterol, Total: 150 mg/dL (ref 100–199)
HDL: 48 mg/dL (ref >39)
LDL Chol Calc (NIH): 89 mg/dL (ref 0–99)
Triglycerides: 67 mg/dL (ref 0–149)
VLDL Cholesterol Cal: 13 mg/dL (ref 5–40)

## 2023-10-06 MED ORDER — ATORVASTATIN CALCIUM 20 MG PO TABS
20.0000 mg | ORAL_TABLET | Freq: Every day | ORAL | 3 refills | Status: AC
Start: 2023-10-06 — End: ?

## 2024-03-28 ENCOUNTER — Ambulatory Visit (HOSPITAL_COMMUNITY): Payer: PRIVATE HEALTH INSURANCE

## 2024-04-27 ENCOUNTER — Ambulatory Visit (HOSPITAL_COMMUNITY)
Admission: RE | Admit: 2024-04-27 | Discharge: 2024-04-27 | Disposition: A | Discharge status: Home / Self Care | Payer: PRIVATE HEALTH INSURANCE | Source: Ambulatory Visit | Attending: Cardiovascular Disease | Admitting: Cardiovascular Disease

## 2024-04-27 DIAGNOSIS — I059 Rheumatic mitral valve disease, unspecified: Secondary | ICD-10-CM | POA: Diagnosis not present

## 2024-04-27 LAB — ECHOCARDIOGRAM COMPLETE
Area-P 1/2: 2.15 cm2
MV VTI: 1.42 cm2
S' Lateral: 2.6 cm

## 2024-04-29 ENCOUNTER — Ambulatory Visit: Payer: Self-pay | Admitting: Cardiovascular Disease

## 2024-04-29 DIAGNOSIS — I7121 Aneurysm of the ascending aorta, without rupture: Secondary | ICD-10-CM

## 2024-04-29 DIAGNOSIS — E782 Mixed hyperlipidemia: Secondary | ICD-10-CM

## 2024-04-29 DIAGNOSIS — I10 Essential (primary) hypertension: Secondary | ICD-10-CM

## 2024-04-29 DIAGNOSIS — Z9889 Other specified postprocedural states: Secondary | ICD-10-CM

## 2024-10-03 ENCOUNTER — Ambulatory Visit: Attending: Student in an Organized Health Care Education/Training Program | Admitting: Cardiovascular Disease

## 2024-10-03 ENCOUNTER — Encounter: Payer: Self-pay | Admitting: Cardiovascular Disease

## 2024-10-03 VITALS — BP 150/97 | HR 61 | Ht 70.0 in | Wt 204.6 lb

## 2024-10-03 DIAGNOSIS — I4891 Unspecified atrial fibrillation: Secondary | ICD-10-CM | POA: Insufficient documentation

## 2024-10-03 DIAGNOSIS — I7121 Aneurysm of the ascending aorta, without rupture: Secondary | ICD-10-CM | POA: Diagnosis present

## 2024-10-03 DIAGNOSIS — E782 Mixed hyperlipidemia: Secondary | ICD-10-CM | POA: Diagnosis not present

## 2024-10-03 DIAGNOSIS — Z9889 Other specified postprocedural states: Secondary | ICD-10-CM | POA: Diagnosis not present

## 2024-10-03 DIAGNOSIS — I10 Essential (primary) hypertension: Secondary | ICD-10-CM | POA: Insufficient documentation

## 2024-10-03 NOTE — Assessment & Plan Note (Signed)
 History of hyperlipidemia on statin therapy with lipid profile performed 10/05/2023 revealing a total cholesterol 150, LDL of 89 and HDL of 48.  Given the fact that he had minimal nonobstructive disease by cath several years ago prior to his mitral valve repair his LDL goal should be less than 70.  Will check a lipid liver profile today.

## 2024-10-03 NOTE — Assessment & Plan Note (Signed)
 History of essential hypertension on Micardis .  He was put on as needed amlodipine.  His blood pressure initially when he was first seen this morning was 150/97, blood pressure at the end of the visit was 124/88.

## 2024-10-03 NOTE — Assessment & Plan Note (Signed)
 Thoracic aortic aneurysm measuring 43 mm by 2D echo 6//43.  This will be repeated in 1 year.

## 2024-10-03 NOTE — Progress Notes (Signed)
 10/03/2024 Ricky Fitzgerald   Nov 28, 1950  [id number]  Primary Physician Ricky Hamilton, MD Primary Cardiologist: Ricky JINNY Lesches MD Ricky Fitzgerald, [place]  HPI:  Ricky Fitzgerald is a 73 y.o.    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 Larnell, for evaluation of severe mitral regurgitation.  I last saw him in the office 10/05/2023.  His only risk factor for cardiovascular disease is treated hypertension.  There is no family history for heart disease.  Never had an attack or stroke.  He denies chest pain or shortness of breath.  He is very active and rides bikes and skis in the winter.  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  He is completely asymptomatic.  I performed coronary angiography on him 02/19/2021 revealing minimal nonobstructive disease in his LAD, elevated V waves on right heart cath consistent with his severe MR.  He underwent minimal invasive mitral valve repair by Dr. Dusty on 02/25/2021 with a Sorin memo 4D ring annuloplasty (30 mm in size) and artificial Gore-Tex neo  cord placement x6.  His follow-up 2D echo performed 04/06/2021 revealed well-placed prosthetic annuloplasty ring with no MR.  He feels clinically improved.  He is back to riding his bicycle.  His amiodarone  was discontinued as was his warfarin because of no recurrent episodes of PAF.  He had some atypical chest pain which was ultimately diagnosed as idiopathic pericarditis which resolved with indomethacin and colchicine  after a 2D echo showed no pericardial effusion and a Myoview stress test was nonischemic.  He is very active and rides his bike several times a week without limitation.  He  did have elevated LFTs thought to be related to statin therapy which resolved on statin holiday although his LDL went up to 118.     Since I saw him a year ago he remained stable.  He is still fairly active and denies chest pain or shortness of breath.  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  He specifically denies chest pain or shortness of breath.   Current Meds  Medication Sig   amLODipine (NORVASC) 5 MG tablet Take 5 mg by mouth daily.   atorvastatin  (LIPITOR) 20 MG tablet Take 1 tablet (20 mg total) by mouth daily.   augmented betamethasone dipropionate (DIPROLENE-AF) 0.05 % cream Apply 1 application topically daily as needed (irritation).   Cholecalciferol (VITAMIN D -3) 125 MCG (5000 UT) TABS Take 5,000 Units by mouth in the morning and at bedtime.   Coenzyme Q10 100 MG TABS Take 100 mg by mouth daily.   desonide (DESOWEN) 0.05 % ointment Apply 1 application topically daily as needed (irritation).   dutasteride  (AVODART ) 0.5 MG capsule Take 0.5 mg by mouth daily.   multivitamin (THERAGRAN) per tablet Take 1 tablet by mouth daily.   tadalafil (CIALIS) 5 MG tablet Take 5 mg by mouth daily.   telmisartan  (MICARDIS ) 80 MG tablet Take 1 tablet (80 mg total) by mouth daily.     No Known Allergies  Social History   Socioeconomic History   Marital status: Divorced  Spouse name: Not on file   Number of children: Not on file   Years of education: Not on file   Highest education level: Not on file  Occupational History   Occupation: physicians assistant  Tobacco Use   Smoking status: Never   Smokeless tobacco: Never  Vaping Use   Vaping status: Never Used  Substance and Sexual Activity   Alcohol use: Yes    Alcohol/week: 5.0 standard drinks of alcohol    Types: 5 Cans of beer per week   Drug use: No   Sexual activity: Yes  Other Topics Concern   Not on file  Social History Narrative   ** Merged History Encounter **        Social Drivers of Health   Financial Resource Strain: Low Risk  (10/26/2022)   Received from [facility]   Overall Financial Resource Strain (CARDIA)    Difficulty of Paying Living Expenses: Not hard at all  Food Insecurity: Low Risk  (10/04/2023)   Received from [facility]   Hunger Vital Sign    Within the past 12 months, you worried that your food would run out before you got money to buy more: Never true    Within the past 12 months, the food you bought just didn't last and you didn't have money to get more. : Never true  Transportation Needs: No Transportation Needs (10/04/2023)   Received from [facility]    In the past 12 months, has lack of reliable transportation kept you from medical appointments, meetings, work or from getting things needed for daily living? : No  Physical Activity: Sufficiently Active (10/26/2022)   Received from [facility]   Exercise Vital Sign    On average, how many days per week do you engage in moderate to strenuous exercise (like a brisk walk)?: 4 days    On average, how many minutes do you engage in exercise at this level?: 60 min  Stress: No Stress Concern Present (10/26/2022)   Received from [facility] - Occupational Stress Questionnaire    Feeling of Stress : Not at all  Social Connections: Socially Integrated (10/26/2022)   Received from [facility]   Social Network    How would you rate your social network (family, work, friends)?: Good participation with social networks  Intimate Partner Violence: Not At Risk (10/26/2022)   Received from [facility]   HITS    Over the last 12 months how often did your partner physically hurt you?: Never    Over the last 12 months how often did your partner insult you or talk down to you?: Never    Over the last 12 months how often did your partner threaten you with physical harm?: Never    Over the last 12 months how often did your  partner scream or curse at you?: Never     Review of Systems: General: negative for chills, fever, night sweats or weight changes.  Cardiovascular: negative for chest pain, dyspnea on exertion, edema, orthopnea, palpitations, paroxysmal nocturnal dyspnea or shortness of breath Dermatological: negative for rash Respiratory: negative for cough or wheezing Urologic: negative for hematuria Abdominal: negative for nausea, vomiting, diarrhea, bright red blood per rectum, melena, or hematemesis Neurologic: negative for visual changes, syncope, or dizziness All other systems reviewed and are otherwise negative except as noted above.    Blood pressure (!) 150/97, pulse 61, height 5' 10 (1.778 m), weight 204 lb  9.6 oz (92.8 kg), SpO2 95%.  General appearance: alert and no distress Neck: no adenopathy, no carotid bruit, no JVD, supple, symmetrical, trachea midline, and thyroid not enlarged, symmetric, no tenderness/mass/nodules Lungs: clear to auscultation bilaterally Heart: regular rate and rhythm, S1, S2 normal, no murmur, click, rub or gallop Extremities: extremities normal, atraumatic, no cyanosis or edema Pulses: 2+ and symmetric Skin: Skin color, texture, turgor normal. No rashes or lesions Neurologic: Grossly normal  EKG EKG Interpretation Date/Time:  Wednesday October 03 2024 09:09:18 EST Ventricular Rate:  61 PR Interval:  168 QRS Duration:  98 QT Interval:  404 QTC Calculation: 406 R Axis:   -69  Text Interpretation: Sinus rhythm with marked sinus arrhythmia Left axis deviation Incomplete right bundle branch block Cannot rule out Anterior infarct , age undetermined When compared with ECG of 05-Oct-2023 09:44, Premature atrial complexes are no longer Present Confirmed by Court Carrier [id number]) on 10/03/2024 9:11:33 AM    ASSESSMENT AND PLAN:   Hyperlipidemia History of hyperlipidemia on statin therapy with lipid profile performed 10/05/2023 revealing a total cholesterol 150,  LDL of 89 and HDL of 48.  Given the fact that he had minimal nonobstructive disease by cath several years ago prior to his mitral valve repair his LDL goal should be less than 70.  Will check a lipid liver profile today.  S/P minimally-invasive mitral valve repair History of severe MR status post minimally invasive mitral valve repair by Dr. Dusty  02/25/21 with a Sorin memo 4D ring annuloplasty (30 mm) with artificial Gore-Tex Neo Cord placement x 6.  He is active and asymptomatic.  His most recent 2D echo performed 04/27/2024 revealed normal LV systolic function, moderate concentric LVH with no evidence of mitral regurgitation.  Will continue to follow this on an annual basis.  Thoracic aortic aneurysm Thoracic aortic aneurysm measuring 43 mm by 2D echo 6//43.  This will be repeated in 1 year.  Essential hypertension History of essential hypertension on Micardis .  He was put on as needed amlodipine.  His blood pressure initially when he was first seen this morning was 150/97, blood pressure at the end of the visit was 124/88.     Carrier DOROTHA Court MD FACP,FACC,FAHA, [facility] 10/03/2024 9:32 AM

## 2024-10-03 NOTE — Assessment & Plan Note (Signed)
 History of severe MR status post minimally invasive mitral valve repair by Dr. Dusty  02/25/21 with a Sorin memo 4D ring annuloplasty (30 mm) with artificial Gore-Tex Neo Cord placement x 6.  He is active and asymptomatic.  His most recent 2D echo performed 04/27/2024 revealed normal LV systolic function, moderate concentric LVH with no evidence of mitral regurgitation.  Will continue to follow this on an annual basis.

## 2024-10-03 NOTE — Patient Instructions (Signed)
 Medication Instructions:  Your physician recommends that you continue on your current medications as directed. Please refer to the Current Medication list given to you today.  *If you need a refill on your cardiac medications before your next appointment, please call your pharmacy*  Lab Work: TODAY: FLP, LFTs If you have labs (blood work) drawn today and your tests are completely normal, you will receive your results only by: MyChart Message (if you have MyChart) OR A paper copy in the mail If you have any lab test that is abnormal or we need to change your treatment, we will call you to review the results.  Testing/Procedures: Your physician has requested that you have an echocardiogram in June 2026. Echocardiography is a painless test that uses sound waves to create images of your heart. It provides your doctor with information about the size and shape of your heart and how well your heart's chambers and valves are working. This procedure takes approximately one hour. There are no restrictions for this procedure. Please do NOT wear cologne, perfume, aftershave, or lotions (deodorant is allowed). Please arrive 15 minutes prior to your appointment time.  Please note: We ask at that you not bring children with you during ultrasound (echo/ vascular) testing. Due to room size and safety concerns, children are not allowed in the ultrasound rooms during exams. Our front office staff cannot provide observation of children in our lobby area while testing is being conducted. An adult accompanying a patient to their appointment will only be allowed in the ultrasound room at the discretion of the ultrasound technician under special circumstances. We apologize for any inconvenience.   Follow-Up: At [facility], you and your health needs are our priority.  As part of our continuing mission to provide you with exceptional heart care, our providers are all part of one team.  This team includes your  primary Cardiologist (physician) and Advanced Practice Providers or APPs (Physician Assistants and Nurse Practitioners) who all work together to provide you with the care you need, when you need it.  Your next appointment:   1 year(s)  Provider:   Dorn Lesches, MD  We recommend signing up for the patient portal called MyChart.  Sign up information is provided on this After Visit Summary.  MyChart is used to connect with patients for Virtual Visits (Telemedicine).  Patients are able to view lab/test results, encounter notes, upcoming appointments, etc.  Non-urgent messages can be sent to your provider as well.    To learn more about what you can do with MyChart, go to [email or web address].

## 2024-10-04 ENCOUNTER — Ambulatory Visit: Payer: Self-pay | Admitting: Cardiovascular Disease

## 2024-10-04 LAB — LIPID PANEL
Chol/HDL Ratio: 3.2 ratio (ref 0.0–5.0)
Cholesterol, Total: 123 mg/dL (ref 100–199)
HDL: 39 mg/dL — ABNORMAL LOW (ref >39)
LDL Chol Calc (NIH): 72 mg/dL (ref 0–99)
Triglycerides: 54 mg/dL (ref 0–149)
VLDL Cholesterol Cal: 12 mg/dL (ref 5–40)

## 2024-10-04 LAB — HEPATIC FUNCTION PANEL
ALT: 30 IU/L (ref 0–44)
AST: 40 IU/L (ref 0–40)
Albumin: 3.8 g/dL (ref 3.8–4.8)
Alkaline Phosphatase: 54 IU/L (ref 47–123)
Bilirubin Total: 1 mg/dL (ref 0.0–1.2)
Bilirubin, Direct: 0.34 mg/dL (ref 0.00–0.40)
Total Protein: 6.1 g/dL (ref 6.0–8.5)

## 2025-04-29 ENCOUNTER — Ambulatory Visit (HOSPITAL_COMMUNITY)
